# Patient Record
Sex: Female | Born: 1965 | Race: White | Hispanic: No | Marital: Single | Smoking: Never smoker
Health system: Southern US, Community
[De-identification: ages and names within clinical notes are randomized; demographics above are authoritative.]

## PROBLEM LIST (undated history)

## (undated) DIAGNOSIS — R87619 Unspecified abnormal cytological findings in specimens from cervix uteri: Secondary | ICD-10-CM

## (undated) DIAGNOSIS — T7840XA Allergy, unspecified, initial encounter: Secondary | ICD-10-CM

## (undated) DIAGNOSIS — F329 Major depressive disorder, single episode, unspecified: Secondary | ICD-10-CM

## (undated) DIAGNOSIS — F32A Depression, unspecified: Secondary | ICD-10-CM

## (undated) DIAGNOSIS — M5126 Other intervertebral disc displacement, lumbar region: Secondary | ICD-10-CM

## (undated) DIAGNOSIS — IMO0002 Reserved for concepts with insufficient information to code with codable children: Secondary | ICD-10-CM

## (undated) HISTORY — DX: Allergy, unspecified, initial encounter: T78.40XA

## (undated) HISTORY — DX: Unspecified abnormal cytological findings in specimens from cervix uteri: R87.619

## (undated) HISTORY — DX: Reserved for concepts with insufficient information to code with codable children: IMO0002

## (undated) HISTORY — DX: Depression, unspecified: F32.A

## (undated) HISTORY — DX: Major depressive disorder, single episode, unspecified: F32.9

## (undated) HISTORY — PX: BACK SURGERY: SHX140

## (undated) HISTORY — DX: Other intervertebral disc displacement, lumbar region: M51.26

---

## 1997-11-26 ENCOUNTER — Other Ambulatory Visit: Admission: RE | Admit: 1997-11-26 | Discharge: 1997-11-26 | Payer: Self-pay | Admitting: Obstetrics and Gynecology

## 1998-11-24 ENCOUNTER — Encounter: Admission: RE | Admit: 1998-11-24 | Discharge: 1999-02-22 | Payer: Self-pay | Admitting: Anesthesiology

## 2000-12-31 ENCOUNTER — Other Ambulatory Visit: Admission: RE | Admit: 2000-12-31 | Discharge: 2000-12-31 | Payer: Self-pay | Admitting: *Deleted

## 2002-04-22 ENCOUNTER — Inpatient Hospital Stay (HOSPITAL_COMMUNITY): Admission: EM | Admit: 2002-04-22 | Discharge: 2002-04-24 | Payer: Self-pay | Admitting: Emergency Medicine

## 2002-04-23 ENCOUNTER — Encounter: Payer: Self-pay | Admitting: Internal Medicine

## 2002-04-24 ENCOUNTER — Encounter: Payer: Self-pay | Admitting: Orthopaedic Surgery

## 2002-05-08 ENCOUNTER — Encounter: Payer: Self-pay | Admitting: Neurosurgery

## 2002-05-08 ENCOUNTER — Ambulatory Visit (HOSPITAL_COMMUNITY): Admission: RE | Admit: 2002-05-08 | Discharge: 2002-05-09 | Payer: Self-pay | Admitting: Neurosurgery

## 2002-06-25 ENCOUNTER — Other Ambulatory Visit: Admission: RE | Admit: 2002-06-25 | Discharge: 2002-06-25 | Payer: Self-pay | Admitting: Family Medicine

## 2002-07-15 ENCOUNTER — Encounter (HOSPITAL_COMMUNITY): Admission: RE | Admit: 2002-07-15 | Discharge: 2002-10-13 | Payer: Self-pay | Admitting: Family Medicine

## 2002-07-16 ENCOUNTER — Encounter: Payer: Self-pay | Admitting: Family Medicine

## 2002-09-16 ENCOUNTER — Encounter: Admission: RE | Admit: 2002-09-16 | Discharge: 2002-09-16 | Payer: Self-pay | Admitting: Neurosurgery

## 2002-09-16 ENCOUNTER — Encounter: Payer: Self-pay | Admitting: Neurosurgery

## 2002-09-16 ENCOUNTER — Encounter: Payer: Self-pay | Admitting: Diagnostic Radiology

## 2002-10-02 ENCOUNTER — Encounter: Payer: Self-pay | Admitting: Neurosurgery

## 2002-10-02 ENCOUNTER — Encounter: Admission: RE | Admit: 2002-10-02 | Discharge: 2002-10-02 | Payer: Self-pay | Admitting: Neurosurgery

## 2003-02-05 ENCOUNTER — Ambulatory Visit (HOSPITAL_COMMUNITY): Admission: RE | Admit: 2003-02-05 | Discharge: 2003-02-06 | Payer: Self-pay | Admitting: Neurosurgery

## 2004-08-08 ENCOUNTER — Other Ambulatory Visit: Admission: RE | Admit: 2004-08-08 | Discharge: 2004-08-08 | Payer: Self-pay | Admitting: Family Medicine

## 2004-09-01 ENCOUNTER — Ambulatory Visit: Payer: Self-pay | Admitting: Pain Medicine

## 2004-09-05 ENCOUNTER — Encounter: Admission: RE | Admit: 2004-09-05 | Discharge: 2004-09-05 | Payer: Self-pay | Admitting: Family Medicine

## 2004-09-08 ENCOUNTER — Ambulatory Visit: Payer: Self-pay | Admitting: Pain Medicine

## 2004-10-27 ENCOUNTER — Ambulatory Visit: Payer: Self-pay | Admitting: Pain Medicine

## 2004-11-22 ENCOUNTER — Ambulatory Visit: Payer: Self-pay | Admitting: Pain Medicine

## 2004-12-26 ENCOUNTER — Ambulatory Visit: Payer: Self-pay | Admitting: Physician Assistant

## 2005-01-22 ENCOUNTER — Ambulatory Visit: Payer: Self-pay | Admitting: Physician Assistant

## 2005-02-13 ENCOUNTER — Ambulatory Visit: Payer: Self-pay | Admitting: Physician Assistant

## 2005-03-26 ENCOUNTER — Ambulatory Visit: Payer: Self-pay | Admitting: Physician Assistant

## 2005-04-26 ENCOUNTER — Ambulatory Visit: Payer: Self-pay | Admitting: Physician Assistant

## 2005-05-22 ENCOUNTER — Ambulatory Visit: Payer: Self-pay | Admitting: Physician Assistant

## 2005-06-20 ENCOUNTER — Ambulatory Visit: Payer: Self-pay | Admitting: Physician Assistant

## 2005-07-19 ENCOUNTER — Ambulatory Visit: Payer: Self-pay | Admitting: Physician Assistant

## 2005-08-21 ENCOUNTER — Ambulatory Visit: Payer: Self-pay | Admitting: Physician Assistant

## 2005-09-18 ENCOUNTER — Ambulatory Visit: Payer: Self-pay | Admitting: Physician Assistant

## 2005-10-17 ENCOUNTER — Ambulatory Visit: Payer: Self-pay | Admitting: Physician Assistant

## 2005-11-26 ENCOUNTER — Ambulatory Visit: Payer: Self-pay | Admitting: Physician Assistant

## 2005-12-18 ENCOUNTER — Ambulatory Visit: Payer: Self-pay | Admitting: Physician Assistant

## 2006-01-15 ENCOUNTER — Ambulatory Visit: Payer: Self-pay | Admitting: Physician Assistant

## 2006-02-14 ENCOUNTER — Ambulatory Visit: Payer: Self-pay | Admitting: Physician Assistant

## 2006-03-15 ENCOUNTER — Ambulatory Visit: Payer: Self-pay | Admitting: Physician Assistant

## 2006-04-16 ENCOUNTER — Ambulatory Visit: Payer: Self-pay | Admitting: Physician Assistant

## 2006-05-16 ENCOUNTER — Ambulatory Visit: Payer: Self-pay | Admitting: Physician Assistant

## 2006-06-13 ENCOUNTER — Ambulatory Visit: Payer: Self-pay | Admitting: Physician Assistant

## 2006-07-12 ENCOUNTER — Ambulatory Visit: Payer: Self-pay | Admitting: Physician Assistant

## 2006-08-15 ENCOUNTER — Ambulatory Visit: Payer: Self-pay | Admitting: Physician Assistant

## 2006-09-12 ENCOUNTER — Ambulatory Visit: Payer: Self-pay | Admitting: Physician Assistant

## 2006-10-10 ENCOUNTER — Ambulatory Visit: Payer: Self-pay | Admitting: Physician Assistant

## 2006-10-24 ENCOUNTER — Encounter: Payer: Self-pay | Admitting: Pain Medicine

## 2006-10-28 ENCOUNTER — Encounter: Payer: Self-pay | Admitting: Pain Medicine

## 2006-10-30 ENCOUNTER — Ambulatory Visit: Payer: Self-pay | Admitting: Pain Medicine

## 2006-11-14 ENCOUNTER — Ambulatory Visit: Payer: Self-pay | Admitting: Physician Assistant

## 2006-12-13 ENCOUNTER — Ambulatory Visit: Payer: Self-pay | Admitting: Physician Assistant

## 2007-01-14 ENCOUNTER — Ambulatory Visit: Payer: Self-pay | Admitting: Physician Assistant

## 2007-02-11 ENCOUNTER — Ambulatory Visit: Payer: Self-pay | Admitting: Physician Assistant

## 2007-05-13 ENCOUNTER — Ambulatory Visit: Payer: Self-pay | Admitting: Physician Assistant

## 2007-09-01 ENCOUNTER — Ambulatory Visit: Payer: Self-pay | Admitting: Pain Medicine

## 2007-09-23 ENCOUNTER — Ambulatory Visit: Payer: Self-pay | Admitting: Physician Assistant

## 2007-11-27 ENCOUNTER — Ambulatory Visit: Payer: Self-pay | Admitting: Physician Assistant

## 2008-10-05 ENCOUNTER — Emergency Department (HOSPITAL_COMMUNITY): Admission: EM | Admit: 2008-10-05 | Discharge: 2008-10-05 | Payer: Self-pay | Admitting: Emergency Medicine

## 2010-06-03 LAB — URINE CULTURE: Colony Count: >=100000

## 2010-06-03 LAB — GLUCOSE, CAPILLARY: Glucose-Capillary: 110 mg/dL — ABNORMAL HIGH (ref 70–99)

## 2010-06-03 LAB — POCT URINALYSIS DIP (DEVICE)
Glucose, UA: 250 mg/dL — AB
Ketones, ur: 15 mg/dL — AB
Nitrite: POSITIVE — AB
Protein, ur: 100 mg/dL — AB
Specific Gravity, Urine: 1.01 (ref 1.005–1.030)
Urobilinogen, UA: 4 mg/dL — ABNORMAL HIGH (ref 0.0–1.0)
pH: 8.5 — ABNORMAL HIGH (ref 5.0–8.0)

## 2010-06-03 LAB — POCT PREGNANCY, URINE: Preg Test, Ur: NEGATIVE

## 2010-07-14 NOTE — H&P (Signed)
NAME:  Andrea Petersen, Andrea Petersen                       ACCOUNT NO.:  [id number]   MEDICAL RECORD NO.:  [id number]                   PATIENT TYPE:  OIB   LOCATION:  3011                                 FACILITY:  [facility]   PHYSICIAN:  Hilda Lias, M.D.                DATE OF BIRTH:  10-29-1965   DATE OF ADMISSION:  02/05/2003  DATE OF DISCHARGE:                                HISTORY & PHYSICAL   HISTORY OF PRESENT ILLNESS:  [name] is a lady who underwent L5-S1  diskectomy back almost 10 months ago.  The patient did well, and later on  she developed more back pain with radiation down to the right leg associated  with weakness.  The patient although was able to work, nevertheless, was  taking quite a bit of pain medications.  Right now she is at the point that  she cannot take any longer the pain and she wants to proceed with surgery as  soon as possible.   PAST MEDICAL HISTORY:  L5-S1 diskectomy.   SOCIAL HISTORY:  Negative.   FAMILY HISTORY:  Father has a history of cancer of the colon and mother had  a tumor removed by me about 15 years ago.   REVIEW OF SYSTEMS:  Positive for back and leg pain.   PHYSICAL EXAMINATION:  GENERAL:  The is a patient who came to my office and  she was limping from the right leg.  HEENT:  Normal.  NECK:  Normal.  LUNGS:  Clear.  HEART:  Normal.  ABDOMEN:  Normal.  EXTREMITIES:  Normal.  NEUROLOGIC:  Mental status and cranial nerves normal.  Strength 5/5 except  for weakness with dorsi and plantar flexion of the right foot.  Sensation:  She complains of numbness which involves mostly the outside of the foot.  Reflexes are decreased to the right ankle jerk.  Straight leg raising, left  side positive about 80 degrees, right side about 45 degrees.   LABORATORY DATA:  The MRI showed that she had a  recurrent disk at the level  of L5-S1 central to the right, and worsening degenerative disk disease at  the L4-5.   CLINICAL IMPRESSION:  1. Right L5-S1  recurrent herniated disk.  2. Degenerative disk disease at the level of L2-3, L3-4, and L4-5.   RECOMMENDATIONS:  The patient is being admitted for surgery which will be a  right L5-S1 diskectomy.  She knows all of the risks, as well as infection,  CSF leak, continuation of back pain because of her degenerative disk  disease, and need for further surgery.                                                Hilda Lias, M.D.    EB/MEDQ  D:  02/05/2003  T:  02/05/2003  Job:  [id number]

## 2010-07-14 NOTE — Op Note (Signed)
   NAME:  Andrea Petersen, Andrea Petersen                       ACCOUNT NO.:  [id number]   MEDICAL RECORD NO.:  [id number]                   PATIENT TYPE:  OIB   LOCATION:  3029                                 FACILITY:  [facility]   PHYSICIAN:  Hilda Lias, M.D.                DATE OF BIRTH:  [birth year]   DATE OF PROCEDURE:  05/08/2002  DATE OF DISCHARGE:                                 OPERATIVE REPORT   PREOPERATIVE DIAGNOSIS:  Right L5-S1 herniated disk with a fragment going to  the body of L5.   POSTOPERATIVE DIAGNOSIS:  Right L5-S1 herniated disk with a fragment going  to the body of L5.   PROCEDURE:  Right L5-S1 removal of seven free fragments of disk,  foraminotomy, microscope, C-arm, [employer].   SURGEON:  Hilda Lias, M.D.   ASSISTANT:  Coletta Memos, M.D.   CLINICAL HISTORY:  The patient is admitted because of back pain with  radiation down to the right leg associated with weakness of the right foot.  MRI shows a large herniated disk with a fragment going to the body of L5.  Surgery was advised.   DESCRIPTION OF PROCEDURE:  The patient was taken to the OR, and she was  positioned in a prone manner.  The back was prepped with Betadine.  With the  C-arm we localized the area between L5-S1 on the right side.  An incision  away from the midline through the skin and fascia was done.  A dilator was  inserted and then a retractor was done.  We were able to visualize L5-S1.  With the drill we drilled the lower lamina of L5 and the upper of S1.  With  the help of the microscope we removed the yellow ligament.  There was some  evidence of cortisone injection in that area.  A foraminotomy was  accomplished to be able to mobilize the S1 nerve root.  The S1 nerve root  was found to be swollen and inflamed.  Retraction was done and, indeed, at  the body of L5 we removed seven free fragments of disk.  The disk itself was  absolutely closed, and there was no need to do any diskectomy.  Investigation  above, below, and laterally was negative at the end.  Valsalva  maneuver was negative.  Then the area was irrigated and fentanyl was left in  the epidural space, and the wound was closed with Vicryl and a Steri-Strip.                                               Hilda Lias, M.D.    EB/MEDQ  D:  05/08/2002  T:  05/08/2002  Job:  [id number]

## 2010-07-14 NOTE — Op Note (Signed)
NAME:  Andrea Petersen, Andrea Petersen                       ACCOUNT NO.:  [id number]   MEDICAL RECORD NO.:  [id number]                   PATIENT TYPE:  OIB   LOCATION:  3172                                 FACILITY:  [facility]   PHYSICIAN:  Hilda Lias, M.D.                DATE OF BIRTH:  Mar 18, 1965   DATE OF PROCEDURE:  02/05/2003  DATE OF DISCHARGE:                                 OPERATIVE REPORT   PREOPERATIVE DIAGNOSIS:  Recurrent L5-S1 herniated disk with degenerative  disk disease at L3-4 and L5-S1.   POSTOPERATIVE DIAGNOSIS:  Recurrent L5-S1 herniated disk with degenerative  disk disease at L3-4 and L5-S1.   PROCEDURE:  Right L5-S1 diskectomy with removal of large recurrent disk  including end plate, decompression of the L5 and S1 nerve root using  microscope.   SURGEON:  Hilda Lias, M.D.   ASSISTANT:  Danae Orleans. Venetia Maxon, M.D.   BRIEF HISTORY:  The patient was admitted because of back pain with right leg  pain.  X-rays showed degenerative disk disease with a large recurrent disk  at L5-S1.  Surgery was advised.   DESCRIPTION OF PROCEDURE:  The patient was taken to the OR and general  anesthesia was administered.  Using the previous incision we opened the skin  all the way down to the fascia.  The fascia was retracted and we entered  into the disk space.  Then using the microscope we drilled the lower lamina  of L5 and the upper  of S1.  Lysis of adhesions was accomplished.  After the  lysis, we were able to retract laterally the S1 nerve root which was  swollen.  Indeed, there were multiple fragments of disk including the end  plates.  All of them were into the body of S1, but mostly going up to L5  medially and laterally.  The disk space was almost completely closed.  Using  the dissection we were able to remove at least ___________ of the foramen  and disk.  At the end we had decompression of the L5-S1 nerve root.  Investigation immediately showed a large ridge and nothing  compromising  either the L5 or S1 nerve root.  From then on the area was irrigated. A  Valsalva maneuver was negative.  Fentanyl and Demerol were injected into the  disk space and the wound was closed with Vicryl and Steri-Strips.                                               Hilda Lias, M.D.    EB/MEDQ  D:  02/05/2003  T:  02/06/2003  Job:  [id number]

## 2010-07-14 NOTE — H&P (Signed)
   NAME:  Andrea Petersen, Andrea Petersen                       ACCOUNT NO.:  [id number]   MEDICAL RECORD NO.:  [id number]                   PATIENT TYPE:  OIB   LOCATION:  3029                                 FACILITY:  [facility]   PHYSICIAN:  Hilda Lias, M.D.                DATE OF BIRTH:  [birth year]   DATE OF ADMISSION:  05/08/2002  DATE OF DISCHARGE:                                HISTORY & PHYSICAL   HISTORY:  Andrea Petersen is a lady who came to my office complaining of back  pain radiates down to the right leg.  This problem has been going on since  2000 and it is getting worse, up to the point that she was admitted by the  orthopedic surgery to [facility] for intravenous morphine for pain  control and had an emergency MRI.  The pain is going to the right leg,  associated with numbness.  She denies involvement of the left leg.  She was  advised of surgery and came to be admitted.   PAST MEDICAL HISTORY:  Negative.   SOCIAL HISTORY:  Negative.   FAMILY HISTORY:  Mother is 25.  She had a tumor removed from the brain 15  years ago by me.  Her father is 3 with cancer of the colon.   REVIEW OF SYSTEMS:  Positive only for back and right leg pain.   PHYSICAL EXAMINATION:  HEENT:  Normal.  NECK:  Normal.  LUNGS:  Clear.  HEART:  Sounds normal.  There were no murmurs.  Normal pulses.  __________  normal.  Strength 5/5 except for in the right foot there was weakness for  plantar flexion 3/5 and dorsiflexion 4/5.  There is numbness involved with  the L5-S1 nerve root.  Reflexes symmetrical but absent on the right ankle.   LABORATORY DATA:  MRI showed that she has some degenerative disease at L4-5.  At L5-S1 she has a herniated disk going up to the L4-5.   IMPRESSION:  1. Right L5-S1 herniated disk with a free fragment.  2. Degenerative disk disease.   RECOMMENDATIONS:  The patient is being admitted for surgery.  The procedure  will be a right L5-S1 diskectomy.  We are going to use the  Matrix as well as  the CR with the microscope.  The risks are no improvement because of the  longstanding radiculopathy, worsening of the pain, need for further surgery,  bleeding, infection.  She requires more opinion.                                               Hilda Lias, M.D.    EB/MEDQ  D:  05/08/2002  T:  05/08/2002  Job:  [id number]

## 2011-01-25 ENCOUNTER — Other Ambulatory Visit (HOSPITAL_COMMUNITY)
Admission: RE | Admit: 2011-01-25 | Discharge: 2011-01-25 | Disposition: A | Discharge status: Home / Self Care | Payer: Self-pay | Source: Ambulatory Visit | Attending: Physician Assistant | Admitting: Physician Assistant

## 2011-01-25 ENCOUNTER — Ambulatory Visit (INDEPENDENT_AMBULATORY_CARE_PROVIDER_SITE_OTHER): Payer: Self-pay | Admitting: Physician Assistant

## 2011-01-25 ENCOUNTER — Encounter: Payer: Self-pay | Admitting: Advanced Practice Midwife

## 2011-01-25 VITALS — BP 138/93 | HR 79 | Temp 98.7°F | Ht 65.0 in | Wt 154.0 lb

## 2011-01-25 DIAGNOSIS — IMO0002 Reserved for concepts with insufficient information to code with codable children: Secondary | ICD-10-CM

## 2011-01-25 DIAGNOSIS — R87613 High grade squamous intraepithelial lesion on cytologic smear of cervix (HGSIL): Secondary | ICD-10-CM

## 2011-01-25 DIAGNOSIS — N871 Moderate cervical dysplasia: Secondary | ICD-10-CM | POA: Insufficient documentation

## 2011-01-25 HISTORY — DX: High grade squamous intraepithelial lesion on cytologic smear of cervix (HGSIL): R87.613

## 2011-01-25 HISTORY — DX: Reserved for concepts with insufficient information to code with codable children: IMO0002

## 2011-01-25 LAB — POCT PREGNANCY, URINE: Preg Test, Ur: NEGATIVE

## 2011-01-25 NOTE — Progress Notes (Signed)
Chief Complaint:  Colposcopy and Pain   Andrea Petersen is  45 y.o. [id number].  Patient's last menstrual period was 01/18/2011.Marland Kitchen  Her pregnancy status is negative.  She presents complaining of Colposcopy and Pain  Presents for colpo secondary to HSIL pap at [facility]. Reports intermittent discomfort with intercourse. States infrequent sexual encounters and occasional discomfort less than once a month.  Obstetrical/Gynecological History: OB History    Grav Para Term Preterm Abortions TAB SAB Ect Mult Living   2 2 2       2       Past Medical History: Past Medical History  Diagnosis Date  . Abnormal Pap smear   . 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 Back surgery     march 2004, dec 2004    Family History: Family History  Problem Relation Age of Onset  . Colon cancer Father   . Breast cancer Mother   . Hypertension Mother   . Miscarriages / [place] Mother     Social History: History  Substance Use Topics  . Smoking status: Never Smoker   . Smokeless tobacco: Never Used  . Alcohol Use: 2.4 - 3.0 oz/week    4-5 Glasses of wine per week    Allergies:  Allergies  Allergen Reactions  . Seasonal      (Not in a hospital admission)  Review of Systems - Negative except what has been reviewed in the HPI  Physical Exam   Blood pressure 138/93, pulse 79, temperature 98.7 F (37.1 C), temperature source Oral, height 5\' 5"  (1.651 m), weight 154 lb (69.854 kg), last menstrual period 01/18/2011.  General: General appearance - alert, well appearing, and in no distress, oriented to person, place, and time and overweight Mental status - alert, oriented to person, place, and time, normal mood, behavior, speech, dress, motor activity, and thought processes, affect appropriate to mood Focused Gynecological Exam:   Patient given informed consent, signed copy in  the chart, time out was performed.  Placed in lithotomy position. Cervix viewed with speculum and colposcope after application of acetic acid.   Colposcopy adequate?  yes Acetowhite lesions?yes Punctation?yes Mosaicism?  yes Abnormal vasculature?  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   Assessment: Patient Active Problem List  Diagnoses  . 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   She will return in 2 weeks for results.  Discussed with patient if CIN II or great tx need, would rec LEEP since done with childbearing.  Jiyan Walkowski E. 01/25/2011,4:31 PM

## 2011-01-25 NOTE — Patient Instructions (Signed)
Colposcopy Care After Colposcopy is a procedure in which a special tool is used to magnify the surface of the cervix. A tissue sample (biopsy) may also be taken. This sample will be looked at for cervical cancer or other problems. After the test:  You may have some cramping.   Lie down for a few minutes if you feel lightheaded.    You may have some bleeding which should stop in a few days.  HOME CARE  Do not have sex or use tampons for 2 to 3 days or as told.   Only take medicine as told by your doctor.   Continue to take your birth control pills as usual.  Finding out the results of your test Ask when your test results will be ready. Make sure you get your test results. GET HELP RIGHT AWAY IF:  You are bleeding a lot or are passing blood clots.   You develop a fever of 102 F (38.9 C) or higher.   You have abnormal vaginal discharge.   You have cramps that do not go away with medicine.   You feel lightheaded, dizzy, or pass out (faint).  MAKE SURE YOU:   Understand these instructions.   Will watch your condition.   Will get help right away if you are not doing well or get worse.  Document Released: 08/01/2007 Document Revised: 10/25/2010 Document Reviewed: 08/01/2007 Exit[employer] Patient Information 2012 [employer]. 

## 2011-02-06 ENCOUNTER — Telehealth: Payer: Self-pay | Admitting: *Deleted

## 2011-02-06 NOTE — Telephone Encounter (Signed)
Response received from Alexander for pt to have appt for results/LEEP eval.  I called pt and informed her of appt on 03/07/11 @ 1445. Pt voiced understanding and agreed.

## 2011-02-06 NOTE — Telephone Encounter (Signed)
Pt left message requesting results via phone from last visit.   Request will be routed to [facility] CNM for response.

## 2011-03-07 ENCOUNTER — Ambulatory Visit (INDEPENDENT_AMBULATORY_CARE_PROVIDER_SITE_OTHER): Payer: Self-pay | Admitting: Obstetrics & Gynecology

## 2011-03-07 ENCOUNTER — Encounter: Payer: Self-pay | Admitting: Obstetrics & Gynecology

## 2011-03-07 VITALS — BP 150/93 | HR 81 | Temp 99.4°F | Ht 64.0 in | Wt 152.2 lb

## 2011-03-07 DIAGNOSIS — N871 Moderate cervical dysplasia: Secondary | ICD-10-CM

## 2011-03-07 NOTE — Progress Notes (Signed)
  Subjective:    Patient ID: Andrea Petersen, female    DOB: 1965-07-07, 46 y.o.   MRN: [id number]  HPI She is here to discuss there results of her colpo/biopsies. 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 The ECC was normal.     Assessment & Plan:  As above- I discussed the risks and benefits of LEEP and cryotherapy. She opts for cryo. She is going to check and see if she was screened for GC and Chlamydia. If not, we can screen her before her cryo.

## 2011-04-04 ENCOUNTER — Ambulatory Visit (INDEPENDENT_AMBULATORY_CARE_PROVIDER_SITE_OTHER): Payer: Self-pay | Admitting: Obstetrics & Gynecology

## 2011-04-04 ENCOUNTER — Encounter: Payer: Self-pay | Admitting: Obstetrics & Gynecology

## 2011-04-04 VITALS — BP 149/100 | HR 99 | Temp 97.4°F | Resp 20 | Ht 65.0 in | Wt 151.0 lb

## 2011-04-04 DIAGNOSIS — N879 Dysplasia of cervix uteri, unspecified: Secondary | ICD-10-CM

## 2011-04-04 HISTORY — PX: OTHER SURGICAL HISTORY: SHX169

## 2011-04-04 LAB — POCT PREGNANCY, URINE: Preg Test, Ur: NEGATIVE

## 2011-04-04 NOTE — Patient Instructions (Signed)
Cervical Dysplasia Cervical dysplasia is a condition in which a woman has abnormal changes in the cells of her cervix. The cervix is the opening to the uterus (womb) between the vagina and the uterus. These changes are called cervical dysplasia and may be the first signs of cervical cancer. These cells can be taken from the cervix during a Pap test and then looked at under a microscope. With early detection, treatment, and close follow-up care, nearly all cervical dysplasia can be cured. If untreated, the mild to moderate stages of dysplasia often grow more severe.  RISK FACTORS  The following increase the risk for cervical dysplasia.  Having had a sexually transmitted disease, including:   Chlamydia.   Human papilloma virus (HPV).   Becoming sexually active before age 18.   Having had more than 1 sexual partner.   Not using protection, such as condoms, during sexual intercourse, especially with new sexual partners.   Having had cancer of the vagina or vulva.   Having a sexual partner whose previous partner had cancer of the cervix or cervical dysplasia.   Having a sexual partner who has or has had cancer of the penis.   Having a weakened immune system (HIV, organ transplant).   Being the daughter of a woman who took DES (diethylstilbestrol) during pregnancy.   A history of cervical cancer in a woman's sister or mother.   Smoking.   Having had an abnormal Pap test in the past.  SYMPTOMS  There are usually no symptoms. If there are symptoms, they may be vague such as:  Abnormal vaginal discharge.   Bleeding between periods or following intercourse.   Bleeding during menopause.   Pain on intercourse (dyspareunia).  DIAGNOSIS   The Pap test is the best way of detecting abnormalities of the cervix.   Biopsy (removing a piece of tissue to look at under the microscope) of the cervix when the Pap test is abnormal or when the Pap test is normal, but the cervix looks abnormal.    TREATMENT  Catching and treating the changes early with Pap tests can prevent cervical cancer.  Cryotherapy freezes the abnormal cells with a steel tip instrument.   A laser can be used to remove the abnormal cells.   Loop electrocautery excision procedure (LEEP). This procedure uses a heated electrical loop to remove a cone-like portion of the cervix, including the cervical canal.   For more serious cases of cervical dysplasia, the abnormal tissue may be removed surgically by:   A cone biopsy (by cold knife, laser or LEEP). A procedure in which a portion of the center of the cervix with the cervical canal is removed.   The uterus and cervix are removed (hysterectomy).  Your caregiver will advise you regarding the need and timing of Pap tests in your follow-up. Women who have been treated for dysplasia should be closely followed with pelvic exams and Pap tests. During the first year following treatment of cervical dysplasia, Pap tests should be done every 3 to 4 months. In the second year, the schedule is every 6 months, or as recommended by your caregiver. See your caregiver for new or worsening problems. HOME CARE INSTRUCTIONS   Follow the instructions and recommendations of your caregiver regarding medicines and follow-up appointments.   Only take over-the-counter or prescription medicines for pain or discomfort as directed by your caregiver.   Cramping and pelvic discomfort may follow cryotherapy. It is not abnormal to have watery discharge for several weeks after.     Laser, cone surgery, cryotherapy or LEEP can cause a bad smelling vaginal discharge. It may also cause vaginal bleeding for a couple weeks following the procedure. The discharge may be black from the paste used to control bleeding from the cone site. This is normal.   Do not use tampons, have sexual intercourse or douche until your caregiver says it is okay.  SEEK MEDICAL CARE IF:   You develop genital warts.   You  need a prescription for pain medicine following your treatment.  SEEK IMMEDIATE MEDICAL CARE IF:   Your bleeding is heavier than a normal menstrual period.   You develop bright red bleeding, especially if you have blood clots.   You have a fever.   You have increasing cramps or pain not relieved with medicine.   You are lightheaded, unusually weak, or have fainting spells.   You have abnormal vaginal discharge.   You develop abdominal pain.  PREVENTION   The surest way to prevent cervical dysplasia is to abstain from sexual intercourse.   Practice safe sex, use condoms and have only one sex partner who does not have other sex partners.   A Pap test is done to screen for cervical cancer.   The first Pap test should be done at age 21.   Between ages 21 and 29, Pap tests are repeated every 2 years.   Beginning at age 30, you are advised to have a Pap test every 3 years as long as your past 3 Pap tests have been normal.   Some women have medical problems that increase the chance of getting cervical cancer. Talk to your caregiver about these problems. It is especially important to talk to your caregiver if a new problem develops soon after your last Pap test. In these cases, your caregiver may recommend more frequent screening and Pap tests.   The above recommendations are the same for women who have or have not gotten the vaccine for HPV (Human Papillomavirus).   If you had a hysterectomy for a problem that was not a cancer or a condition that could lead to cancer, then you no longer need Pap tests. However, even if you no longer need a Pap test, a regular exam is a good idea to make sure no other problems are starting.    If you are between ages 65 and 70, and you have had normal Pap tests going back 10 years, you no longer need Pap tests. However, even if you no longer need a Pap test, a regular exam is a good idea to make sure no other problems are starting.    If you have  had past treatment for cervical cancer or a condition that could lead to cancer, you need Pap tests and screening for cancer for at least 20 years after your treatment.   If Pap tests have been discontinued, risk factors (such as a new sexual partner) need to be re-assessed to determine if screening should be resumed.   Some women may need screenings more often if they are at high risk for cervical cancer.   Your caregiver may do additional tests including:   Colposcopy. A procedure in which a special microscope magnifies the cells and allows the provider to closely examine the cervix, vagina, and vulva.   Biopsy. A small tissue sample is taken from the cervix, vagina or vulva. This is generally done in your caregivers office.   A cone biopsy (cold knife or laser). A large tissue sample is   taken from the cervix. This procedure is usually done in an operating room under a general anesthetic. The cone often removes all abnormal tissue and so may also complete the treatment.   LEEP, also removing a circular portion of the cervix and is done in a doctors office under a local anesthetic.   Now there is a vaccine, Gardasil, that was developed to prevent the HPV'S that can cause cancer of the cervix and genital warts. It is recommended for females ages 9 to 26. It should not be given to pregnant women until more is known about its effects on the fetus. Not all cancers of the cervix are caused by the HPV. Routine gynecology exams and Pap tests should continue as recommended by your caregiver.  Document Released: 02/12/2005 Document Revised: 10/25/2010 Document Reviewed: 02/04/2008 Exit[employer] Patient Information 2012 [employer]. 

## 2011-04-04 NOTE — Progress Notes (Signed)
Pt states she is very anxious about cryo procedure today. She has taken tramadol 100 mg total as prep for exam.  Pt was advised to seek care from a PCP for hypertension evaluation and treatment if indicated. [id number] Patient's last menstrual period was 03/31/2011. 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 Marice Potter for cryotherapy today. The procedure and risks were explained and her questions were answered. Consent was signed and time-out performed. Small probe was used for 65min-5-3 technique without complications.

## 2011-04-09 ENCOUNTER — Ambulatory Visit (INDEPENDENT_AMBULATORY_CARE_PROVIDER_SITE_OTHER): Payer: Self-pay | Admitting: Family

## 2011-04-09 ENCOUNTER — Encounter: Payer: Self-pay | Admitting: Family

## 2011-04-09 ENCOUNTER — Telehealth: Payer: Self-pay | Admitting: Obstetrics and Gynecology

## 2011-04-09 VITALS — BP 140/96 | HR 88 | Temp 99.4°F | Ht 65.0 in | Wt 150.2 lb

## 2011-04-09 DIAGNOSIS — N949 Unspecified condition associated with female genital organs and menstrual cycle: Secondary | ICD-10-CM

## 2011-04-09 DIAGNOSIS — R102 Pelvic and perineal pain: Secondary | ICD-10-CM

## 2011-04-09 LAB — POCT URINALYSIS DIP (DEVICE)
Bilirubin Urine: NEGATIVE
Glucose, UA: NEGATIVE mg/dL
Hgb urine dipstick: NEGATIVE
Ketones, ur: NEGATIVE mg/dL
Leukocytes, UA: NEGATIVE
Nitrite: NEGATIVE
Protein, ur: NEGATIVE mg/dL
Specific Gravity, Urine: 1.01 (ref 1.005–1.030)
Urobilinogen, UA: 0.2 mg/dL (ref 0.0–1.0)
pH: 7 (ref 5.0–8.0)

## 2011-04-09 LAB — WET PREP, GENITAL
Trich, Wet Prep: NONE SEEN
Yeast Wet Prep HPF POC: NONE SEEN

## 2011-04-09 MED ORDER — TRAMADOL HCL 50 MG PO TABS: 50.0000 mg | ORAL_TABLET | Freq: Four times a day (QID) | ORAL | Status: AC | PRN

## 2011-04-09 NOTE — Telephone Encounter (Signed)
Patient called with c/o continuous pelvic pain, described as dull, and achy since last Wednesday post-cryo procedure. Reported 3/10 on pain scale w/o pain meds. States that she also have yellow tinge d/c with slight odor. Appointment made for this afternoon at 1pm for further evaluation. Patient agreed.

## 2011-04-09 NOTE — Progress Notes (Signed)
States has found out had intercourse last 03/12/11 and that partner had removed condom during intercourse. Is here today for c/o pelvic pain that started 04/06/11-states having  yellowish discharge and odor has increased.Discussed patient BP elevated today and last 2 visits, encouraged patient to see primary care provider which is [facility]. Recorded pt BP's and gave her to take with her to visit.

## 2011-04-09 NOTE — Progress Notes (Signed)
  Subjective:    Patient ID: Andrea Petersen, female    DOB: [birth year], 46 y.o.   MRN: [id number]  HPI Pt here with report of pelvic pain that increased in intensity on 04/06/11.  S/P cryotherapy on 04/04/11.  Pain is described as "crampy" and rated a 5/10.  Received relief with Tramadol.  Also reports yellowish discharge that started on 04/06/11 with slight odor.     Review of Systems  Constitutional: Negative.   Genitourinary: Positive for vaginal discharge (yellowish) and pelvic pain. Negative for vaginal bleeding.       Objective:   Physical Exam  Constitutional: She is oriented to person, place, and time. She appears well-developed and well-nourished. No distress.  HENT:  Head: Normocephalic.  Neck: Normal range of motion. Neck supple.  Abdominal: Soft. There is no tenderness.  Genitourinary: Uterus is tender (mild). Cervix exhibits discharge ( thick, white scar tissue covering cervix). Vaginal discharge (yellow tinged watery discharge) found.  Neurological: She is alert and oriented to person, place, and time.  Skin: Skin is warm and dry.     Results for orders placed in visit on 04/09/11 (from the past 24 hour(s))  POCT URINALYSIS DIP (DEVICE)     Status: Normal   Collection Time   04/09/11  2:02 PM      Component Value Range   Glucose, UA NEGATIVE  NEGATIVE (mg/dL)   Bilirubin Urine NEGATIVE  NEGATIVE    Ketones, ur NEGATIVE  NEGATIVE (mg/dL)   Specific Gravity, Urine 1.010  1.005 - 1.030    Hgb urine dipstick NEGATIVE  NEGATIVE    pH 7.0  5.0 - 8.0    Protein, ur NEGATIVE  NEGATIVE (mg/dL)   Urobilinogen, UA 0.2  0.0 - 1.0 (mg/dL)   Nitrite NEGATIVE  NEGATIVE    Leukocytes, UA NEGATIVE  NEGATIVE         Assessment & Plan:  Post Cryo Pain  Plan:   Urine GC/CT Wet prep TX Tramadol  F/U as requested [facility]

## 2011-04-09 NOTE — Patient Instructions (Signed)
Cryotherapy Cryotherapy means treatment with cold. Ice or gel packs can be used to reduce both pain and swelling. Ice is the most helpful within the first 24 to 48 hours after an injury or flareup from overusing a muscle or joint. Sprains, strains, spasms, burning pain, shooting pain, and aches can all be eased with ice. Ice can also be used when recovering from surgery. Ice is effective, has very few side effects, and is safe for most people to use. PRECAUTIONS   Ice is not a safe treatment option for people with:  Raynaud's phenomenon. This is a condition affecting small blood vessels in the extremities. Exposure to cold may cause your problems to return.     Cold hypersensitivity. There are many forms of cold hypersensitivity, including:     Cold urticaria. Red, itchy hives appear on the skin when the tissues begin to warm after being iced.     Cold erythema. This is a red, itchy rash caused by exposure to cold.     Cold hemoglobinuria. Red blood cells break down when the tissues begin to warm after being iced. The hemoglobin that carry oxygen are passed into the urine because they cannot combine with blood proteins fast enough.     Numbness or altered sensitivity in the area being iced.  If you have any of the following conditions, do not use ice until you have discussed cryotherapy with your caregiver:  Heart conditions, such as arrhythmia, angina, or chronic heart disease.     High blood pressure.     Healing wounds or open skin in the area being iced.     Current infections.     Rheumatoid arthritis.     Poor circulation.     Diabetes.  Ice slows the blood flow in the region it is applied. This is beneficial when trying to stop inflamed tissues from spreading irritating chemicals to surrounding tissues. However, if you expose your skin to cold temperatures for too long or without the proper protection, you can damage your skin or nerves. Watch for signs of skin damage due to  cold. HOME CARE INSTRUCTIONS Follow these tips to use ice and cold packs safely.  Place a dry or damp towel between the ice and skin. A damp towel will cool the skin more quickly, so you may need to shorten the time that the ice is used.     For a more rapid response, add gentle compression to the ice.     Ice for no more than 10 to 20 minutes at a time. The bonier the area you are icing, the less time it will take to get the benefits of ice.     Check your skin after 5 minutes to make sure there are no signs of a poor response to cold or skin damage.     Rest 20 minutes or more in between uses.     Once your skin is numb, you can end your treatment. You can test numbness by very lightly touching your skin. The touch should be so light that you do not see the skin dimple from the pressure of your fingertip. When using ice, most people will feel these normal sensations in this order: cold, burning, aching, and numbness.     Do not use ice on someone who cannot communicate their responses to pain, such as small children or people with dementia.  HOW TO MAKE AN ICE PACK Ice packs are the most common way to use ice  therapy. Other methods include ice massage, ice baths, and cryo-sprays. Muscle creams that cause a cold, tingly feeling do not offer the same benefits that ice offers and should not be used as a substitute unless recommended by your caregiver. To make an ice pack, do one of the following:  Place crushed ice or a bag of frozen vegetables in a sealable plastic bag. Squeeze out the excess air. Place this bag inside another plastic bag. Slide the bag into a pillowcase or place a damp towel between your skin and the bag.     Mix 3 parts water with 1 part rubbing alcohol. Freeze the mixture in a sealable plastic bag. When you remove the mixture from the freezer, it will be slushy. Squeeze out the excess air. Place this bag inside another plastic bag. Slide the bag into a pillowcase or place a  damp towel between your skin and the bag.  SEEK MEDICAL CARE IF:  You develop white spots on your skin. This may give the skin a blotchy (mottled) appearance.     Your skin turns blue or pale.     Your skin becomes waxy or hard.     Your swelling gets worse.  MAKE SURE YOU:    Understand these instructions.     Will watch your condition.     Will get help right away if you are not doing well or get worse.  Document Released: 10/09/2010 Document Reviewed: 10/05/2010 [facility] Patient Information 2012 [place], [place].

## 2011-04-10 LAB — GC/CHLAMYDIA PROBE AMP, URINE
Chlamydia, Swab/Urine, PCR: NEGATIVE
GC Probe Amp, Urine: NEGATIVE

## 2011-04-19 ENCOUNTER — Other Ambulatory Visit: Payer: Self-pay | Admitting: Family

## 2011-05-03 ENCOUNTER — Ambulatory Visit (INDEPENDENT_AMBULATORY_CARE_PROVIDER_SITE_OTHER): Payer: Self-pay | Admitting: Obstetrics & Gynecology

## 2011-05-03 ENCOUNTER — Encounter: Payer: Self-pay | Admitting: Obstetrics & Gynecology

## 2011-05-03 DIAGNOSIS — N949 Unspecified condition associated with female genital organs and menstrual cycle: Secondary | ICD-10-CM

## 2011-05-03 DIAGNOSIS — IMO0002 Reserved for concepts with insufficient information to code with codable children: Secondary | ICD-10-CM

## 2011-05-03 DIAGNOSIS — R87613 High grade squamous intraepithelial lesion on cytologic smear of cervix (HGSIL): Secondary | ICD-10-CM

## 2011-05-03 DIAGNOSIS — R102 Pelvic and perineal pain unspecified side: Secondary | ICD-10-CM

## 2011-05-03 DIAGNOSIS — N898 Other specified noninflammatory disorders of vagina: Secondary | ICD-10-CM

## 2011-05-03 MED ORDER — DICLOFENAC SODIUM 75 MG PO TBEC: 75.0000 mg | DELAYED_RELEASE_TABLET | Freq: Two times a day (BID) | ORAL | Status: AC

## 2011-05-03 NOTE — Patient Instructions (Signed)
Return to clinic for any gynecologic concerns or for scheduled pap smear

## 2011-05-03 NOTE — Progress Notes (Signed)
History:  46 y.o. [id number] here today for persistent yellow-orange discharge after her cryotherapy on 04/04/11 done for HGSIL pap. Also reports cramping.  Had low grade fever, but reports having URI symptoms.  No other GYN concerns.  The following portions of the patient's history were reviewed and updated as appropriate: allergies, current medications, past family history, past medical history, past social history, past surgical history and problem list.   Objective:  Physical Exam Blood pressure 136/91, pulse 96, temperature 98.8 F (37.1 C), temperature source Oral, height 5\' 5"  (1.651 m), weight 142 lb 1.6 oz (64.456 kg), last menstrual period 04/23/2011. Gen: NAD Abd: Soft, nontender and nondistended Pelvic: Normal appearing external genitalia; normal appearing vaginal mucosa. Yellow discharge noted in vault.  Cervical lesion after crytotherapy is healing well, had some friable areas noted on the surface.  GC/Chlam, wet prep samples obtained. Mild CMT. Small uterus, no palpable masses or adnexal tenderness.  Assessment & Plan:  Discharge normal after cryotherapy, but will follow up sample results and treat accordingly. Fever likely 2/2 URI.  Diclofenac DR prescription given.  Pain and bleeding precautions reviewed.

## 2011-05-04 LAB — WET PREP, GENITAL
Clue Cells Wet Prep HPF POC: NONE SEEN
Trich, Wet Prep: NONE SEEN
Yeast Wet Prep HPF POC: NONE SEEN

## 2011-05-04 LAB — GC/CHLAMYDIA PROBE AMP, GENITAL
Chlamydia, DNA Probe: NEGATIVE
GC Probe Amp, Genital: NEGATIVE

## 2012-04-17 ENCOUNTER — Other Ambulatory Visit (HOSPITAL_COMMUNITY): Payer: Self-pay | Admitting: Internal Medicine

## 2012-04-17 DIAGNOSIS — Z1231 Encounter for screening mammogram for malignant neoplasm of breast: Secondary | ICD-10-CM

## 2012-04-24 ENCOUNTER — Ambulatory Visit (HOSPITAL_COMMUNITY)
Admission: RE | Admit: 2012-04-24 | Discharge: 2012-04-24 | Disposition: A | Discharge status: Home / Self Care | Payer: Self-pay | Source: Ambulatory Visit | Attending: Internal Medicine | Admitting: Internal Medicine

## 2012-04-24 DIAGNOSIS — Z1231 Encounter for screening mammogram for malignant neoplasm of breast: Secondary | ICD-10-CM

## 2012-04-29 ENCOUNTER — Other Ambulatory Visit: Payer: Self-pay | Admitting: Internal Medicine

## 2012-04-29 DIAGNOSIS — R928 Other abnormal and inconclusive findings on diagnostic imaging of breast: Secondary | ICD-10-CM

## 2012-05-12 ENCOUNTER — Encounter (HOSPITAL_COMMUNITY): Payer: Self-pay | Admitting: *Deleted

## 2012-05-20 ENCOUNTER — Encounter (HOSPITAL_COMMUNITY): Payer: Self-pay

## 2012-05-20 ENCOUNTER — Ambulatory Visit (HOSPITAL_COMMUNITY)
Admission: RE | Admit: 2012-05-20 | Discharge: 2012-05-20 | Disposition: A | Discharge status: Home / Self Care | Payer: Self-pay | Source: Ambulatory Visit | Attending: Obstetrics and Gynecology | Admitting: Obstetrics and Gynecology

## 2012-05-20 VITALS — BP 112/70 | Temp 98.0°F | Ht 65.0 in | Wt 142.4 lb

## 2012-05-20 DIAGNOSIS — IMO0001 Reserved for inherently not codable concepts without codable children: Secondary | ICD-10-CM | POA: Insufficient documentation

## 2012-05-20 DIAGNOSIS — Z1239 Encounter for other screening for malignant neoplasm of breast: Secondary | ICD-10-CM

## 2012-05-20 NOTE — Patient Instructions (Addendum)
Taught patient how to perform BSE. Patient did not need a Pap smear today due to last Pap smear was 03/31/2012. Referred patient to the [facility] for colposcopy to follow up for abnormal Pap smear. Appointment scheduled for Monday, June 02, 2012 at 1400. Referred patient to the [facility] for left breast diagnostic mammogram and ultrasound per recommendation. Appointment scheduled for Wednesday, May 28, 2012 at 1510. Patient aware of appointments and will be there. Patient verbalized understanding.

## 2012-05-20 NOTE — Progress Notes (Signed)
Patient referred to [facility] due to needing additional imaging of the left breast. Screening mammogram completed at [facility] 04/24/2012. Patient came to the Free Cervical cancer screening at the [facility] on 03/31/2012 and had an abnormal Pap smear that a colposcopy is recommended for follow up.  Pap Smear:    Pap smear not completed today. Last Pap smear was 03/31/2012 at the free cervical cancer screening at the [facility] and ASCUS HPV+. Referred patient to the [facility] for colposcopy to follow up for abnormal Pap smear. Appointment scheduled for Monday, June 02, 2012 at 1400.  Per patient has a history of an abnormal Pap smear at the end of 2012 that required a colposcopy and cryo for follow up. Pap smear result is scanned in EPIC under media.  Physical exam: Breasts Breasts symmetrical. No skin abnormalities bilateral breasts. No nipple retraction bilateral breasts. No nipple discharge bilateral breasts. No lymphadenopathy. No lumps palpated bilateral breasts. No complaints of pain or tenderness on exam. Referred patient to the [facility] for left breast diagnostic mammogram and ultrasound per recommendation. Appointment scheduled for Wednesday, May 28, 2012 at 1510.       Pelvic/Bimanual No Pap smear completed today since last Pap smear was 03/31/2012. Pap smear not indicated per BCCCP guidelines.

## 2012-05-28 ENCOUNTER — Ambulatory Visit
Admission: RE | Admit: 2012-05-28 | Discharge: 2012-05-28 | Disposition: A | Discharge status: Home / Self Care | Payer: No Typology Code available for payment source | Source: Ambulatory Visit | Attending: Internal Medicine | Admitting: Internal Medicine

## 2012-05-28 DIAGNOSIS — R928 Other abnormal and inconclusive findings on diagnostic imaging of breast: Secondary | ICD-10-CM

## 2012-06-02 ENCOUNTER — Encounter: Payer: Self-pay | Admitting: Obstetrics & Gynecology

## 2012-06-02 ENCOUNTER — Ambulatory Visit (INDEPENDENT_AMBULATORY_CARE_PROVIDER_SITE_OTHER): Payer: Self-pay | Admitting: Obstetrics & Gynecology

## 2012-06-02 ENCOUNTER — Other Ambulatory Visit (HOSPITAL_COMMUNITY)
Admission: RE | Admit: 2012-06-02 | Discharge: 2012-06-02 | Disposition: A | Discharge status: Home / Self Care | Payer: Self-pay | Source: Ambulatory Visit | Attending: Obstetrics & Gynecology | Admitting: Obstetrics & Gynecology

## 2012-06-02 ENCOUNTER — Other Ambulatory Visit: Payer: Self-pay | Admitting: Obstetrics & Gynecology

## 2012-06-02 VITALS — BP 149/80 | HR 78 | Ht 64.0 in | Wt 142.7 lb

## 2012-06-02 DIAGNOSIS — R87811 Vaginal high risk human papillomavirus (HPV) DNA test positive: Secondary | ICD-10-CM

## 2012-06-02 DIAGNOSIS — N889 Noninflammatory disorder of cervix uteri, unspecified: Secondary | ICD-10-CM

## 2012-06-02 DIAGNOSIS — N72 Inflammatory disease of cervix uteri: Secondary | ICD-10-CM | POA: Insufficient documentation

## 2012-06-02 DIAGNOSIS — IMO0002 Reserved for concepts with insufficient information to code with codable children: Secondary | ICD-10-CM

## 2012-06-02 LAB — POCT PREGNANCY, URINE: Preg Test, Ur: NEGATIVE

## 2012-06-02 NOTE — Progress Notes (Signed)
  Subjective:    Patient ID: Andrea Petersen, female    DOB: 1966-02-20, 47 y.o.   MRN: [id number]  HPI  47 yo SW lady who is here for a colposcopy today. 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 She had a cryo in 1/13 and a follow up pap at the free clinic (cancer screening) 2/14. It showed ASCUS + HR HPV.  Review of Systems She is interested in [place] for birth control.    Objective:   Physical Exam 2 cervical ostia noted. I was able to canulate each with a Q tip (handle).   UPT negative, consent signed, time out done Cervix prepped with acetic acid. Transformation zone seen in its entirety. Colpo adequate. No evidence of dysplasia ECC obtained. She tolerated the procedure well.      Assessment & Plan:  2 ostia noted. I looked back at her previous notes by advanced practitioners and could not find this cervical abnormality noted. I will order an U/S to see if a Mullerian duct abnormality is present (ie. How many IUDs does she need). I spoke with a radiologist who says that a MRI may be needed in the future.

## 2012-06-03 ENCOUNTER — Encounter: Payer: Self-pay | Admitting: *Deleted

## 2012-06-18 ENCOUNTER — Ambulatory Visit (INDEPENDENT_AMBULATORY_CARE_PROVIDER_SITE_OTHER): Payer: Self-pay | Admitting: Obstetrics & Gynecology

## 2012-06-18 VITALS — BP 136/92 | HR 83 | Temp 98.3°F | Ht 65.5 in | Wt 140.0 lb

## 2012-06-18 DIAGNOSIS — IMO0002 Reserved for concepts with insufficient information to code with codable children: Secondary | ICD-10-CM

## 2012-06-18 DIAGNOSIS — R6889 Other general symptoms and signs: Secondary | ICD-10-CM

## 2012-06-18 NOTE — Progress Notes (Signed)
  Subjective:    Patient ID: Andrea Petersen, female    DOB: 10/24/1965, 47 y.o.   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 She had an ASCUS pap, +HPV, followed by a colpo 2 weeks ago here. She is here today for the results of her ECC. It was negative. She has not yet had her u/s because of insurance reasons. She will get insurance next month and already has an u/s scheduled.  Review of Systems     Objective:   Physical Exam        Assessment & Plan:  H/o abnormal paps- negative ECC. RTC 1 year for annual/pap Contraception- She does not want anything with hormones. She is considering a Copper T IUD, but I will need to find out if she has a uterine abnormality (awaiting u/s).

## 2012-06-30 ENCOUNTER — Ambulatory Visit (HOSPITAL_COMMUNITY)
Admission: RE | Admit: 2012-06-30 | Discharge: 2012-06-30 | Disposition: A | Discharge status: Home / Self Care | Payer: BC Managed Care – PPO | Source: Ambulatory Visit | Attending: Obstetrics & Gynecology | Admitting: Obstetrics & Gynecology

## 2012-06-30 DIAGNOSIS — N949 Unspecified condition associated with female genital organs and menstrual cycle: Secondary | ICD-10-CM | POA: Insufficient documentation

## 2012-06-30 DIAGNOSIS — N854 Malposition of uterus: Secondary | ICD-10-CM | POA: Insufficient documentation

## 2012-06-30 DIAGNOSIS — IMO0002 Reserved for concepts with insufficient information to code with codable children: Secondary | ICD-10-CM | POA: Insufficient documentation

## 2012-06-30 DIAGNOSIS — N938 Other specified abnormal uterine and vaginal bleeding: Secondary | ICD-10-CM | POA: Insufficient documentation

## 2012-07-30 ENCOUNTER — Other Ambulatory Visit: Payer: Self-pay | Admitting: Obstetrics & Gynecology

## 2012-07-30 ENCOUNTER — Ambulatory Visit (INDEPENDENT_AMBULATORY_CARE_PROVIDER_SITE_OTHER): Payer: BC Managed Care – PPO | Admitting: Obstetrics & Gynecology

## 2012-07-30 ENCOUNTER — Encounter: Payer: Self-pay | Admitting: Obstetrics & Gynecology

## 2012-07-30 VITALS — BP 137/95 | HR 82 | Ht 65.0 in | Wt 142.0 lb

## 2012-07-30 DIAGNOSIS — Z30430 Encounter for insertion of intrauterine contraceptive device: Secondary | ICD-10-CM

## 2012-07-30 DIAGNOSIS — Z975 Presence of (intrauterine) contraceptive device: Secondary | ICD-10-CM

## 2012-07-30 LAB — POCT PREGNANCY, URINE: Preg Test, Ur: NEGATIVE

## 2012-07-30 MED ORDER — PARAGARD INTRAUTERINE COPPER IU IUD
1.0000 [IU] | INTRAUTERINE_SYSTEM | Freq: Once | INTRAUTERINE | Status: AC
  Administered 2012-07-30: 1 [IU] via INTRAUTERINE

## 2012-07-30 NOTE — Addendum Note (Signed)
Addended by: Toula Moos on: 07/30/2012 03:15 PM   Modules accepted: Orders

## 2012-07-30 NOTE — Progress Notes (Signed)
  Subjective:    Patient ID: Andrea Petersen, female    DOB: 1965-07-01, 47 y.o.   MRN: [id number]  HPI  47 yo SW lady who wants a Paraguard IUD.  Review of Systems     Objective:   Physical Exam  UPT negative, consent signed, Time out procedure done. Cervix prepped with betadine and grasped with a single tooth tenaculum. Paraguard was easily placed and the strings were cut to 3-4 cm. Uterus sounded to 9 cm. She tolerated the procedure well.       Assessment & Plan:

## 2012-09-01 ENCOUNTER — Encounter: Payer: Self-pay | Admitting: *Deleted

## 2012-09-10 ENCOUNTER — Emergency Department (HOSPITAL_COMMUNITY)
Admission: EM | Admit: 2012-09-10 | Discharge: 2012-09-10 | Disposition: A | Discharge status: Home / Self Care | Payer: BC Managed Care – PPO | Source: Home / Self Care

## 2012-09-10 ENCOUNTER — Encounter (HOSPITAL_COMMUNITY): Payer: Self-pay | Admitting: *Deleted

## 2012-09-10 DIAGNOSIS — J069 Acute upper respiratory infection, unspecified: Secondary | ICD-10-CM

## 2012-09-10 DIAGNOSIS — J309 Allergic rhinitis, unspecified: Secondary | ICD-10-CM

## 2012-09-10 LAB — POCT RAPID STREP A: Streptococcus, Group A Screen (Direct): NEGATIVE

## 2012-09-10 NOTE — ED Notes (Signed)
C/o waking up with scratchy itchy throat yesterday morning.  Coughed up some phlegm.  Today it was yellowish green.  C/o slight burning in her sinuses and burning headache.

## 2012-09-10 NOTE — ED Provider Notes (Signed)
History    CSN: [id number] Arrival date & time 09/10/12  1652  None    Chief Complaint  Patient presents with  . URI   (Consider location/radiation/quality/duration/timing/severity/associated sxs/prior Treatment) HPI Comments: 34 her old female presents with a scratchy throat started yesterday. She also has a mild headache and itching burning in the ears. Positive for PND. She states that she does not feel really bad but feels like she is coming down with something and she started not to come in.  Past Medical History  Diagnosis Date  . Abnormal Pap smear   . 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 Ruptured lumbar disc     L5/S1   Past Surgical History  Procedure Laterality Date  . Back surgery      march 2004, dec 2004  . Cryotheraphy  04/04/11   Family History  Problem Relation Age of Onset  . Colon cancer Father   . COPD Father   . Breast cancer Mother   . Hypertension Mother   . Miscarriages / [place] Mother   . Hyperlipidemia Brother    History  Substance Use Topics  . Smoking status: Never Smoker   . Smokeless tobacco: Never Used  . Alcohol Use: 2.4 - 3 oz/week    4-5 Glasses of wine per week     Comment: occasional   OB History   Grav Para Term Preterm Abortions TAB SAB Ect Mult Living   2 2 2       2      Review of Systems  Constitutional: Positive for activity change. Negative for fever, chills, appetite change and fatigue.  HENT: Positive for sore throat, rhinorrhea and postnasal drip. Negative for congestion, facial swelling, neck pain and neck stiffness.   Eyes: Negative.   Respiratory: Negative.   Cardiovascular: Negative.   Gastrointestinal: Negative.   Skin: Negative for pallor and rash.  Neurological: Negative.     Allergies  Sulfa antibiotics  Home Medications   Current Outpatient Rx  Name  Route  Sig  Dispense  Refill  . aspirin 325 MG tablet   Oral   Take 650 mg by mouth every 4 (four)  hours as needed for pain.          . Cetirizine HCl (ZYRTEC PO)   Oral   Take 10 mg by mouth daily.           Marland Kitchen ibuprofen (ADVIL,MOTRIN) 200 MG tablet   Oral   Take 400 mg by mouth every 6 (six) hours as needed. Takes 2- 4 tabs depending on pain level         . guaiFENesin (MUCINEX) 600 MG 12 hr tablet   Oral   Take 400 mg by mouth 2 (two) times daily.         . naproxen sodium (ANAPROX) 220 MG tablet   Oral   Take 220 mg by mouth as needed.         . pseudoephedrine (SUDAFED) 120 MG 12 hr tablet   Oral   Take 120 mg by mouth every 12 (twelve) hours.          BP 142/86  Pulse 74  Temp(Src) 98.4 F (36.9 C) (Oral)  Resp 17  SpO2 100%  LMP 08/30/2012 Physical Exam  Nursing note and vitals reviewed. Constitutional: She is oriented to person, place, and time. She appears well-developed and well-nourished. No distress.  HENT:  Right Ear: External ear normal.  Left Ear:  External ear normal.  Oropharynx with minor light erythema to the left and the right. Central pharynx is clear. No exudates or swelling. Bilateral TMs are normal.  Eyes: Conjunctivae are normal.  Neck: Normal range of motion. Neck supple.  Positive anterior cervical lymphadenitis.  Cardiovascular: Normal rate and regular rhythm.   Pulmonary/Chest: Effort normal and breath sounds normal. No respiratory distress.  Musculoskeletal: Normal range of motion. She exhibits no edema.  Lymphadenopathy:    She has cervical adenopathy.  Neurological: She is alert and oriented to person, place, and time.  Skin: Skin is warm and dry. No rash noted.  Psychiatric: She has a normal mood and affect.    ED Course  Procedures (including critical care time) Labs Reviewed  CULTURE, GROUP A STREP  POCT RAPID STREP A ([facility]RG CARE ONLY)   No results found. 1. Allergic rhinitis   2. URI (upper respiratory infection)     MDM  Instructions on both URI and allergic rhinitis. Patient believes she has more of a  URI. Nevertheless the treatment would be the same in regards to symptoms relief. May take antihistamines and/or decongestants as needed. Drink plenty of fluids and stay well hydrated. Take Tylenol and/or ibuprofen if needed for discomfort. 3 new symptoms or problems or worsening may return or follow up with your primary care doctor. The throat tests will be cultured and if it turns out to be positive for strep call you and treat over the phone. Otherwise, I see no reason to treat with antibiotics at this time.  Hayden Rasmussen, NP 09/10/12 1757  Hayden Rasmussen, NP 09/11/12 1349

## 2012-09-11 NOTE — ED Provider Notes (Signed)
Medical screening examination/treatment/procedure(s) were performed by non-physician practitioner and as supervising physician I was immediately available for consultation/collaboration.  Raynald Blend, MD 09/11/12 [phone]

## 2012-09-12 LAB — CULTURE, GROUP A STREP

## 2013-01-01 ENCOUNTER — Other Ambulatory Visit: Payer: Self-pay

## 2013-10-13 ENCOUNTER — Other Ambulatory Visit: Payer: Self-pay

## 2013-10-14 ENCOUNTER — Ambulatory Visit (INDEPENDENT_AMBULATORY_CARE_PROVIDER_SITE_OTHER): Payer: BC Managed Care – PPO | Admitting: Obstetrics & Gynecology

## 2013-10-14 ENCOUNTER — Encounter: Payer: Self-pay | Admitting: Obstetrics & Gynecology

## 2013-10-14 VITALS — BP 137/90 | HR 82 | Temp 98.2°F | Ht 65.0 in | Wt 152.6 lb

## 2013-10-14 DIAGNOSIS — Z01419 Encounter for gynecological examination (general) (routine) without abnormal findings: Secondary | ICD-10-CM | POA: Diagnosis not present

## 2013-10-14 DIAGNOSIS — Z975 Presence of (intrauterine) contraceptive device: Secondary | ICD-10-CM | POA: Insufficient documentation

## 2013-10-14 HISTORY — DX: Presence of (intrauterine) contraceptive device: Z97.5

## 2013-10-14 NOTE — Patient Instructions (Signed)
Intrauterine Device Information An intrauterine device (IUD) is inserted into your uterus to prevent pregnancy. There are two types of IUDs available:   Copper IUD--This type of IUD is wrapped in copper wire and is placed inside the uterus. Copper makes the uterus and fallopian tubes produce a fluid that kills sperm. The copper IUD can stay in place for 10 years.  Hormone IUD--This type of IUD contains the hormone progestin (synthetic progesterone). The hormone thickens the cervical mucus and prevents sperm from entering the uterus. It also thins the uterine lining to prevent implantation of a fertilized egg. The hormone can weaken or kill the sperm that get into the uterus. One type of hormone IUD can stay in place for 5 years, and another type can stay in place for 3 years. Your health care provider will make sure you are a good candidate for a contraceptive IUD. Discuss with your health care provider the possible side effects.  ADVANTAGES OF AN INTRAUTERINE DEVICE  IUDs are highly effective, reversible, long acting, and low maintenance.   There are no estrogen-related side effects.   An IUD can be used when breastfeeding.   IUDs are not associated with weight gain.   The copper IUD works immediately after insertion.   The hormone IUD works right away if inserted within 7 days of your period starting. You will need to use a backup method of birth control for 7 days if the hormone IUD is inserted at any other time in your cycle.  The copper IUD does not interfere with your female hormones.   The hormone IUD can make heavy menstrual periods lighter and decrease cramping.   The hormone IUD can be used for 3 or 5 years.   The copper IUD can be used for 10 years. DISADVANTAGES OF AN INTRAUTERINE DEVICE  The hormone IUD can be associated with irregular bleeding patterns.   The copper IUD can make your menstrual flow heavier and more painful.   You may experience cramping and  vaginal bleeding after insertion.  Document Released: 01/17/2004 Document Revised: 10/15/2012 Document Reviewed: 08/03/2012 [employer] Patient Information 2015 [employer]. This information is not intended to replace advice given to you by your health care provider. Make sure you discuss any questions you have with your health care provider.  

## 2013-10-14 NOTE — Progress Notes (Signed)
Patient ID: Andrea Petersen, female   DOB: 13-Mar-1965, 48 y.o.   MRN: [id number]  Chief Complaint  Patient presents with  . Gynecologic Exam    HPI Andrea Petersen is a 48 y.o. female.  [id number] Patient's last menstrual period was 10/14/2013. Paragard in place, menses are a bit longer now but not heavy. Need f/u pap with h/o HSIL and cryo in 2013, ASCUS pos HR HPV 2014, neg colpo and ECC then  HPI  Past Medical History  Diagnosis Date  . Abnormal Pap smear   . 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 Ruptured lumbar disc     L5/S1    Past Surgical History  Procedure Laterality Date  . Back surgery      march 2004, dec 2004  . Cryotheraphy  04/04/11    Family History  Problem Relation Age of Onset  . Colon cancer Father   . COPD Father   . Breast cancer Mother   . Hypertension Mother   . Miscarriages / [place] Mother   . Cancer Mother   . Hyperlipidemia Brother     Social History History  Substance Use Topics  . Smoking status: Never Smoker   . Smokeless tobacco: Never Used  . Alcohol Use: 2.4 - 3.0 oz/week    4-5 Glasses of wine per week     Comment: occasional    Allergies  Allergen Reactions  . Sulfa Antibiotics Hives    Current Outpatient Prescriptions  Medication Sig Dispense Refill  . aspirin 325 MG tablet Take 650 mg by mouth every 4 (four) hours as needed for pain.       . Cetirizine HCl (ZYRTEC PO) Take 10 mg by mouth daily.        Marland Kitchen guaiFENesin (MUCINEX) 600 MG 12 hr tablet Take 400 mg by mouth 2 (two) times daily.      Marland Kitchen ibuprofen (ADVIL,MOTRIN) 200 MG tablet Take 400 mg by mouth every 6 (six) hours as needed. Takes 2- 4 tabs depending on pain level      . naproxen sodium (ANAPROX) 220 MG tablet Take 220 mg by mouth as needed.      . pseudoephedrine (SUDAFED) 120 MG 12 hr tablet Take 120 mg by mouth every 12 (twelve) hours.       No current facility-administered medications for this visit.    Review of  Systems Review of Systems  Constitutional: Negative.   Respiratory: Negative.   Gastrointestinal: Negative.   Genitourinary: Positive for vaginal bleeding (brown). Negative for vaginal discharge, menstrual problem and pelvic pain.    Blood pressure 137/90, pulse 82, temperature 98.2 F (36.8 C), temperature source Oral, height 5\' 5"  (1.651 m), weight 152 lb 9.6 oz (69.219 kg), last menstrual period 10/14/2013.  Physical Exam Physical Exam  Constitutional: She is oriented to person, place, and time. She appears well-developed. No distress.  Neck: Normal range of motion.  Pulmonary/Chest: Effort normal. No respiratory distress.  Breasts: breasts appear normal, no suspicious masses, no skin or nipple changes or axillary nodes.   Abdominal: Soft. She exhibits no distension. There is no tenderness.  Genitourinary: Vagina normal and uterus normal.  No mass, not tender. Pap done   Musculoskeletal: Normal range of motion.  Neurological: She is alert and oriented to person, place, and time.  Skin: Skin is warm and dry.  Psychiatric: She has a normal mood and affect. Her behavior is normal.    Data Reviewed Pap and biopsy  Assessment    Well-woman exam with history of HSIL, Paragard     Plan    Mammogram, f/u on pap and repeat in 1 year if negative         Fortune Brannigan 10/14/2013, 2:37 PM

## 2013-10-15 LAB — CYTOLOGY - PAP

## 2013-11-16 ENCOUNTER — Telehealth: Payer: Self-pay | Admitting: *Deleted

## 2013-11-16 NOTE — Telephone Encounter (Signed)
Matalyn called and left a message she came in for a pap and std screening 10/14/13 and has not gotten any results- would like a call with results.

## 2013-11-16 NOTE — Telephone Encounter (Addendum)
Called Andrea Petersen , unable to leave message- heard message voice mailbox is full and cannot accept messages at this time.  Per chart pap was negative for malignancie, hrhpr, gonorrhea , chlamydia.  State previous atypia from previous pap  not seen. ( ASCUS)

## 2013-11-17 NOTE — Telephone Encounter (Signed)
Called patient and informed her of results and that no changes were present on this pap smear. Patient verbalized understanding and asked why it is last year it was abnormal and no one did anything. Told patient that if the changes were low enough sometimes they will not do anything because they feel her cervix would return to normal on its own which is what happened for her. Patient verbalized understanding and had no other questions

## 2013-11-24 ENCOUNTER — Ambulatory Visit (HOSPITAL_COMMUNITY)
Admission: RE | Admit: 2013-11-24 | Discharge: 2013-11-24 | Disposition: A | Discharge status: Home / Self Care | Payer: BC Managed Care – PPO | Source: Ambulatory Visit | Attending: Obstetrics & Gynecology | Admitting: Obstetrics & Gynecology

## 2013-11-24 DIAGNOSIS — Z01419 Encounter for gynecological examination (general) (routine) without abnormal findings: Secondary | ICD-10-CM

## 2013-11-24 DIAGNOSIS — Z1231 Encounter for screening mammogram for malignant neoplasm of breast: Secondary | ICD-10-CM | POA: Insufficient documentation

## 2013-12-28 ENCOUNTER — Encounter: Payer: Self-pay | Admitting: Obstetrics & Gynecology

## 2014-01-31 ENCOUNTER — Emergency Department (INDEPENDENT_AMBULATORY_CARE_PROVIDER_SITE_OTHER)
Admission: EM | Admit: 2014-01-31 | Discharge: 2014-01-31 | Disposition: A | Discharge status: Home / Self Care | Payer: BC Managed Care – PPO | Source: Home / Self Care | Attending: Family Medicine | Admitting: Family Medicine

## 2014-01-31 ENCOUNTER — Encounter (HOSPITAL_COMMUNITY): Payer: Self-pay | Admitting: *Deleted

## 2014-01-31 DIAGNOSIS — M545 Low back pain, unspecified: Secondary | ICD-10-CM

## 2014-01-31 MED ORDER — CYCLOBENZAPRINE HCL 5 MG PO TABS: 5.0000 mg | ORAL_TABLET | Freq: Three times a day (TID) | ORAL | Status: DC | PRN

## 2014-01-31 MED ORDER — TRAMADOL HCL 50 MG PO TABS: 50.0000 mg | ORAL_TABLET | Freq: Four times a day (QID) | ORAL | Status: DC | PRN

## 2014-01-31 NOTE — ED Provider Notes (Signed)
Andrea Petersen is a 48 y.o. female who presents to Urgent Care today for patient has a 2 day history of bilateral low back pain. No radiating pain weakness or numbness. No bowel bladder problems or difficulty walking. The pain is moderate to severe. She has tried ibuprofen and Flexeril and a back brace which have helped. She has history of 2 prior back surgeries due to a ruptured disc.  Pain started after patient bent down to dry her legs after after taking a shower. She felt a pulling sensation.   Past Medical History  Diagnosis Date  . Abnormal Pap smear   . 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 Ruptured lumbar disc     L5/S1   Past Surgical History  Procedure Laterality Date  . Back surgery      march 2004, dec 2004  . Cryotheraphy  04/04/11   History  Substance Use Topics  . Smoking status: Never Smoker   . Smokeless tobacco: Never Used  . Alcohol Use: Yes     Comment: occasional   ROS as above Medications: No current facility-administered medications for this encounter.   Current Outpatient Prescriptions  Medication Sig Dispense Refill  . aspirin 325 MG tablet Take 650 mg by mouth every 4 (four) hours as needed for pain.     . cyclobenzaprine (FLEXERIL) 5 MG tablet Take 1-2 tablets (5-10 mg total) by mouth 3 (three) times daily as needed for muscle spasms. 60 tablet 0  . traMADol (ULTRAM) 50 MG tablet Take 1 tablet (50 mg total) by mouth every 6 (six) hours as needed. 15 tablet 0  . [DISCONTINUED] Cetirizine HCl (ZYRTEC PO) Take 10 mg by mouth daily.       Allergies  Allergen Reactions  . Sulfa Antibiotics Hives     Exam:  BP 150/83 mmHg  Pulse 85  Temp(Src) 97.5 F (36.4 C) (Oral)  Resp 16  SpO2 100%  LMP 01/11/2014 (Exact Date) Gen: Well NAD Back: Nontender to spinal midline.Bilateral lumbar paraspinals. Low back range of motion is limited by pain. Lotion strength is equal and normal bilaterally. Reflexes are equal and  normal bilaterally. Normal gait. Normal sensation throughout.  No results found for this or any previous visit (from the past 24 hour(s)). No results found.  Assessment and Plan: 48 y.o. female with lumbago due to myofascial disruption. Treatment with physical therapy, Flexeril and tramadol and ibuprofen. Follow up with sports medicine as needed.  Discussed warning signs or symptoms. Please see discharge instructions. Patient expresses understanding.     Rodolph BongEvan S Corey, MD 01/31/14 [id number]

## 2014-01-31 NOTE — Discharge Instructions (Signed)
Thank you for coming in today. Come back or go to the emergency room if you notice new weakness new numbness problems walking or bowel or bladder problems. Follow-up with Dr. Quitman LivingsSmith  sports medicine as needed. Attend physical therapy.    Back Exercises These exercises may help you when beginning to rehabilitate your injury. Your symptoms may resolve with or without further involvement from your physician, physical therapist or athletic trainer. While completing these exercises, remember:   Restoring tissue flexibility helps normal motion to return to the joints. This allows healthier, less painful movement and activity.  An effective stretch should be held for at least 30 seconds.  A stretch should never be painful. You should only feel a gentle lengthening or release in the stretched tissue. STRETCH - Extension, Prone on Elbows   Lie on your stomach on the floor, a bed will be too soft. Place your palms about shoulder width apart and at the height of your head.  Place your elbows under your shoulders. If this is too painful, stack pillows under your chest.  Allow your body to relax so that your hips drop lower and make contact more completely with the floor.  Hold this position for __________ seconds.  Slowly return to lying flat on the floor. Repeat __________ times. Complete this exercise __________ times per day.  RANGE OF MOTION - Extension, Prone Press Ups   Lie on your stomach on the floor, a bed will be too soft. Place your palms about shoulder width apart and at the height of your head.  Keeping your back as relaxed as possible, slowly straighten your elbows while keeping your hips on the floor. You may adjust the placement of your hands to maximize your comfort. As you gain motion, your hands will come more underneath your shoulders.  Hold this position __________ seconds.  Slowly return to lying flat on the floor. Repeat __________ times. Complete this exercise  __________ times per day.  RANGE OF MOTION- Quadruped, Neutral Spine   Assume a hands and knees position on a firm surface. Keep your hands under your shoulders and your knees under your hips. You may place padding under your knees for comfort.  Drop your head and point your tail bone toward the ground below you. This will round out your low back like an angry cat. Hold this position for __________ seconds.  Slowly lift your head and release your tail bone so that your back sags into a large arch, like an old horse.  Hold this position for __________ seconds.  Repeat this until you feel limber in your low back.  Now, find your "sweet spot." This will be the most comfortable position somewhere between the two previous positions. This is your neutral spine. Once you have found this position, tense your stomach muscles to support your low back.  Hold this position for __________ seconds. Repeat __________ times. Complete this exercise __________ times per day.  STRETCH - Flexion, Single Knee to Chest   Lie on a firm bed or floor with both legs extended in front of you.  Keeping one leg in contact with the floor, bring your opposite knee to your chest. Hold your leg in place by either grabbing behind your thigh or at your knee.  Pull until you feel a gentle stretch in your low back. Hold __________ seconds.  Slowly release your grasp and repeat the exercise with the opposite side. Repeat __________ times. Complete this exercise __________ times per day.  STRETCH -  Hamstrings, Standing  Stand or sit and extend your right / left leg, placing your foot on a chair or foot stool  Keeping a slight arch in your low back and your hips straight forward.  Lead with your chest and lean forward at the waist until you feel a gentle stretch in the back of your right / left knee or thigh. (When done correctly, this exercise requires leaning only a small distance.)  Hold this position for __________  seconds. Repeat __________ times. Complete this stretch __________ times per day. STRENGTHENING - Deep Abdominals, Pelvic Tilt   Lie on a firm bed or floor. Keeping your legs in front of you, bend your knees so they are both pointed toward the ceiling and your feet are flat on the floor.  Tense your lower abdominal muscles to press your low back into the floor. This motion will rotate your pelvis so that your tail bone is scooping upwards rather than pointing at your feet or into the floor.  With a gentle tension and even breathing, hold this position for __________ seconds. Repeat __________ times. Complete this exercise __________ times per day.  STRENGTHENING - Abdominals, Crunches   Lie on a firm bed or floor. Keeping your legs in front of you, bend your knees so they are both pointed toward the ceiling and your feet are flat on the floor. Cross your arms over your chest.  Slightly tip your chin down without bending your neck.  Tense your abdominals and slowly lift your trunk high enough to just clear your shoulder blades. Lifting higher can put excessive stress on the low back and does not further strengthen your abdominal muscles.  Control your return to the starting position. Repeat __________ times. Complete this exercise __________ times per day.  STRENGTHENING - Quadruped, Opposite UE/LE Lift   Assume a hands and knees position on a firm surface. Keep your hands under your shoulders and your knees under your hips. You may place padding under your knees for comfort.  Find your neutral spine and gently tense your abdominal muscles so that you can maintain this position. Your shoulders and hips should form a rectangle that is parallel with the floor and is not twisted.  Keeping your trunk steady, lift your right hand no higher than your shoulder and then your left leg no higher than your hip. Make sure you are not holding your breath. Hold this position __________  seconds.  Continuing to keep your abdominal muscles tense and your back steady, slowly return to your starting position. Repeat with the opposite arm and leg. Repeat __________ times. Complete this exercise __________ times per day. Document Released: 03/02/2005 Document Revised: 05/07/2011 Document Reviewed: 05/27/2008 [facility] Patient Information 2015 [place], [place]. This information is not intended to replace advice given to you by your health care provider. Make sure you discuss any questions you have with your health care provider.

## 2014-01-31 NOTE — ED Notes (Signed)
States got out of shower, bent over to dry legs and felt sudden onset severe low back pain.  Denies radiation of pain.  Has taken NSAIDs and 2 doses of her mother's cyclobenzaprine today, and had improvement.  Denies parasthesias.

## 2014-02-01 ENCOUNTER — Telehealth (HOSPITAL_COMMUNITY): Payer: Self-pay

## 2014-02-01 NOTE — ED Notes (Signed)
Dr Denyse Amasscorey reviewed chart and asked that patient bring paperwork for him to review.  Attempt to call patient and there was not an answer.  Message left for her to call us back

## 2014-02-09 ENCOUNTER — Ambulatory Visit: Payer: BC Managed Care – PPO | Admitting: Physical Therapy

## 2015-06-27 ENCOUNTER — Encounter: Payer: Self-pay | Admitting: Internal Medicine

## 2016-05-27 HISTORY — PX: OTHER SURGICAL HISTORY: SHX169

## 2016-06-14 ENCOUNTER — Encounter: Payer: Self-pay | Admitting: Gastroenterology

## 2016-06-25 ENCOUNTER — Encounter (HOSPITAL_COMMUNITY): Payer: Self-pay | Admitting: Emergency Medicine

## 2016-06-25 ENCOUNTER — Ambulatory Visit (HOSPITAL_COMMUNITY)
Admission: EM | Admit: 2016-06-25 | Discharge: 2016-06-25 | Disposition: A | Discharge status: Home / Self Care | Payer: 59 | Attending: Internal Medicine | Admitting: Internal Medicine

## 2016-06-25 DIAGNOSIS — M5432 Sciatica, left side: Secondary | ICD-10-CM

## 2016-06-25 DIAGNOSIS — M545 Low back pain: Secondary | ICD-10-CM

## 2016-06-25 MED ORDER — KETOROLAC TROMETHAMINE 30 MG/ML IJ SOLN
INTRAMUSCULAR | Status: AC
  Filled 2016-06-25: qty 1

## 2016-06-25 MED ORDER — KETOROLAC TROMETHAMINE 30 MG/ML IJ SOLN
30.0000 mg | Freq: Once | INTRAMUSCULAR | Status: AC
  Administered 2016-06-25: 30 mg via INTRAMUSCULAR

## 2016-06-25 MED ORDER — PREDNISONE 10 MG (21) PO TBPK: ORAL_TABLET | Freq: Every day | ORAL | 0 refills | Status: DC

## 2016-06-25 MED ORDER — CYCLOBENZAPRINE HCL 10 MG PO TABS: 10.0000 mg | ORAL_TABLET | Freq: Three times a day (TID) | ORAL | 0 refills | Status: AC | PRN

## 2016-06-25 NOTE — ED Triage Notes (Signed)
Pt is having left lower back pain that radiates down her left hip and leg since Friday.  Pt states she has been treated for this in the past and had surgery in 2004.

## 2016-06-25 NOTE — ED Provider Notes (Signed)
CSN: [id number]     Arrival date & time 06/25/16  1359 History   First MD Initiated Contact with Patient 06/25/16 1441     Chief Complaint  Patient presents with  . Back Pain   (Consider location/radiation/quality/duration/timing/severity/associated sxs/prior Treatment) Patient is c/o left lower back pain radiating down her left left to her left big toe.  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  She contacted her Neurosurgeon office.  She states the pain yesterday was like the pain she had back in 2005.  She took a few of her mother's tramadol and flexeril and it didn't help.   The history is provided by the patient.  Back Pain  Location:  Lumbar spine Quality:  Aching Radiates to:  L foot Pain severity:  Moderate Pain is:  Worse during the day Onset quality:  Sudden Duration:  4 days Timing:  Constant Progression:  Worsening Chronicity:  New Relieved by:  Nothing Worsened by:  Ambulation Ineffective treatments:  Muscle relaxants Associated symptoms: numbness, paresthesias and tingling     Past Medical History:  Diagnosis Date  . Abnormal Pap smear   . 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 Ruptured lumbar disc    L5/S1   Past Surgical History:  Procedure Laterality Date  . BACK SURGERY     march 2004, dec 2004  . cryotheraphy  04/04/11   Family History  Problem Relation Age of Onset  . Colon cancer Father   . COPD Father   . Breast cancer Mother   . Hypertension Mother   . Miscarriages / [place] Mother   . Cancer Mother   . Hyperlipidemia Brother    Social History  Substance Use Topics  . Smoking status: Never Smoker  . Smokeless tobacco: Never Used  . Alcohol use Yes     Comment: occasional   OB History    Gravida Para Term Preterm AB Living   SAB TAB Ectopic Multiple Live Births                 Review of Systems  Constitutional: Negative.   HENT: Negative.   Eyes: Negative.    Respiratory: Negative.   Cardiovascular: Negative.   Gastrointestinal: Negative.   Endocrine: Negative.   Genitourinary: Negative.   Musculoskeletal: Positive for back pain.  Skin: Negative.   Allergic/Immunologic: Negative.   Neurological: Positive for tingling, numbness and paresthesias.    Allergies  Sulfa antibiotics  Home Medications   Prior to Admission medications   Medication Sig Start Date End Date Taking? Authorizing Provider  aspirin 325 MG tablet Take 650 mg by mouth every 4 (four) hours as needed for pain.     Historical Provider, MD  cyclobenzaprine (FLEXERIL) 10 MG tablet Take 1 tablet (10 mg total) by mouth 3 (three) times daily as needed for muscle spasms. 06/25/16   Deatra Canter, FNP  cyclobenzaprine (FLEXERIL) 5 MG tablet Take 1-2 tablets (5-10 mg total) by mouth 3 (three) times daily as needed for muscle spasms. 01/31/14   Rodolph Bong, MD  predniSONE (STERAPRED UNI-PAK 21 TAB) 10 MG (21) TBPK tablet Take by mouth daily. Take 6 tabs by mouth daily  for 2 days, then 5 tabs for 2 days, then 4 tabs for 2 days, then 3 tabs for 2 days, 2 tabs for 2 days, then 1 tab by mouth daily for 2 days  06/25/16   Deatra Canter, FNP  traMADol (ULTRAM) 50 MG tablet Take 1 tablet (50 mg total) by mouth every 6 (six) hours as needed. 01/31/14   Rodolph Bong, MD   Meds Ordered and Administered this Visit   Medications  ketorolac (TORADOL) 30 MG/ML injection 30 mg (not administered)    BP (!) 139/91 (BP Location: Right Arm)   Pulse (!) 101   Temp 98.7 F (37.1 C) (Oral)   SpO2 100%  No data found.   Physical Exam  Constitutional: She appears well-developed and well-nourished.  HENT:  Head: Normocephalic and atraumatic.  Eyes: Conjunctivae and EOM are normal. Pupils are equal, round, and reactive to light.  Neck: Normal range of motion. Neck supple.  Cardiovascular: Normal rate, regular rhythm and normal heart sounds.   Pulmonary/Chest: Effort normal and breath sounds  normal.  Musculoskeletal: She exhibits tenderness.  TTP left lumbar paraspinous muscles.  Positive SLR left leg.  Nursing note and vitals reviewed.   Urgent Care Course     Procedures (including critical care time)  Labs Review Labs Reviewed - No data to display  Imaging Review No results found.   Visual Acuity Review  Right Eye Distance:   Left Eye Distance:   Bilateral Distance:    Right Eye Near:   Left Eye Near:    Bilateral Near:         MDM   1. Left sided sciatica   2. Sciatica of left side    Toradol  IM Sterapred dose pack DS #42 Flexeril  one po tid prn #30   Deatra Canter, FNP 06/25/16 1506    Deatra Canter, FNP 06/25/16 [phone]

## 2016-07-27 ENCOUNTER — Ambulatory Visit (AMBULATORY_SURGERY_CENTER): Payer: Self-pay

## 2016-07-27 VITALS — Ht 64.0 in | Wt 138.0 lb

## 2016-07-27 DIAGNOSIS — Z1211 Encounter for screening for malignant neoplasm of colon: Secondary | ICD-10-CM

## 2016-07-27 MED ORDER — NA SULFATE-K SULFATE-MG SULF 17.5-3.13-1.6 GM/177ML PO SOLN: 1.0000 | Freq: Once | ORAL | 0 refills | Status: AC

## 2016-07-27 NOTE — Progress Notes (Signed)
Denies allergies to eggs or soy products. Denies complication of anesthesia or sedation. Denies use of weight loss medication. Denies use of O2.   Emmi instructions given for colonoscopy.  

## 2016-07-31 ENCOUNTER — Encounter: Payer: Self-pay | Admitting: Gastroenterology

## 2016-08-10 ENCOUNTER — Ambulatory Visit (AMBULATORY_SURGERY_CENTER): Payer: 59 | Admitting: Gastroenterology

## 2016-08-10 ENCOUNTER — Telehealth: Payer: Self-pay

## 2016-08-10 ENCOUNTER — Encounter: Payer: Self-pay | Admitting: Gastroenterology

## 2016-08-10 ENCOUNTER — Telehealth: Payer: Self-pay | Admitting: *Deleted

## 2016-08-10 VITALS — BP 156/93 | HR 84 | Temp 98.4°F | Resp 14 | Ht 64.0 in | Wt 138.0 lb

## 2016-08-10 DIAGNOSIS — Z1211 Encounter for screening for malignant neoplasm of colon: Secondary | ICD-10-CM | POA: Diagnosis not present

## 2016-08-10 DIAGNOSIS — Z1212 Encounter for screening for malignant neoplasm of rectum: Secondary | ICD-10-CM | POA: Diagnosis not present

## 2016-08-10 MED ORDER — SODIUM CHLORIDE 0.9 % IV SOLN: 500.0000 mL | INTRAVENOUS | Status: AC

## 2016-08-10 NOTE — Telephone Encounter (Signed)
Phone call to patient---Dr. Lavon PaganiniNandigam was paged from answering service. Pt states she vomited second part of prep this morning, states that bowel movements are lightly cloudy yellow. Encouraged patient to drink water until 830 am.

## 2016-08-10 NOTE — Op Note (Signed)
[facility] Patient Name: Andrea Petersen Procedure Date: 08/10/2016 11:26 AM MRN: [id number] Endoscopist: Napoleon Form , MD Age: 51 Referring MD:  Date of Birth: [birth year] Gender: Female Account #: [id number] Procedure:                Colonoscopy Indications:              Screening in patient at increased risk: Colorectal                            cancer in father 77 or older Medicines:                Monitored Anesthesia Care Procedure:                Pre-Anesthesia Assessment:                           - Prior to the procedure, a History and Physical                            was performed, and patient medications and                            allergies were reviewed. The patient's tolerance of                            previous anesthesia was also reviewed. The risks                            and benefits of the procedure and the sedation                            options and risks were discussed with the patient.                            All questions were answered, and informed consent                            was obtained. Prior Anticoagulants: The patient has                            taken no previous anticoagulant or antiplatelet                            agents. ASA Grade Assessment: II - A patient with                            mild systemic disease. After reviewing the risks                            and benefits, the patient was deemed in                            satisfactory condition to undergo the procedure.  After obtaining informed consent, the colonoscope                            was passed under direct vision. Throughout the                            procedure, the patient's blood pressure, pulse, and                            oxygen saturations were monitored continuously. The                            Colonoscope was introduced through the anus and                            advanced to the the  cecum, identified by                            appendiceal orifice and ileocecal valve. The                            colonoscopy was performed without difficulty. The                            patient tolerated the procedure well. The quality                            of the bowel preparation was excellent. The                            ileocecal valve, appendiceal orifice, and rectum                            were photographed. Scope In: 11:32:25 AM Scope Out: 11:47:28 AM Scope Withdrawal Time: 0 hours 10 minutes 7 seconds  Total Procedure Duration: 0 hours 15 minutes 3 seconds  Findings:                 Non-bleeding internal hemorrhoids were found during                            retroflexion. The hemorrhoids were small.                           The exam was otherwise without abnormality. Complications:            No immediate complications. Estimated Blood Loss:     Estimated blood loss: none. Impression:               - Non-bleeding internal hemorrhoids.                           - The examination was otherwise normal.                           - No specimens collected. Recommendation:           -  Patient has a contact number available for                            emergencies. The signs and symptoms of potential                            delayed complications were discussed with the                            patient. Return to normal activities tomorrow.                            Written discharge instructions were provided to the                            patient.                           - Resume previous diet.                           - Continue present medications.                           - No aspirin, ibuprofen, naproxen, or other                            non-steroidal anti-inflammatory drugs.                           - Repeat colonoscopy in 5 years for surveillance.                           - Return to GI clinic PRN. Napoleon FormKavitha V. Nandigam, MD 08/10/2016  11:57:08 AM This report has been signed electronically.

## 2016-08-10 NOTE — Progress Notes (Signed)
A and O x3. Report to RN. Tolerated MAC anesthesia well.

## 2016-08-10 NOTE — Progress Notes (Signed)
Pt's states no medical or surgical changes since previsit or office visit. 

## 2016-08-10 NOTE — Telephone Encounter (Signed)
Called pt (Dr Lavon PaganiniNandigam wanted us to discuss risks involved with taking too many NSAIDS) and discussed importance of not over-taking NSAIDS.  Pt voiced understanding. Andrea Petersen/ Recovery Room

## 2016-08-10 NOTE — Patient Instructions (Signed)
YOU HAD AN ENDOSCOPIC PROCEDURE TODAY AT THE [facility]:   Refer to the procedure report that was given to you for any specific questions about what was found during the examination.  If the procedure report does not answer your questions, please call your gastroenterologist to clarify.  If you requested that your care partner not be given the details of your procedure findings, then the procedure report has been included in a sealed envelope for you to review at your convenience later.  YOU SHOULD EXPECT: Some feelings of bloating in the abdomen. Passage of more gas than usual.  Walking can help get rid of the air that was put into your GI tract during the procedure and reduce the bloating. If you had a lower endoscopy (such as a colonoscopy or flexible sigmoidoscopy) you may notice spotting of blood in your stool or on the toilet paper. If you underwent a bowel prep for your procedure, you may not have a normal bowel movement for a few days.  Please Note:  You might notice some irritation and congestion in your nose or some drainage.  This is from the oxygen used during your procedure.  There is no need for concern and it should clear up in a day or so.  SYMPTOMS TO REPORT IMMEDIATELY:   Following lower endoscopy (colonoscopy or flexible sigmoidoscopy):  Excessive amounts of blood in the stool  Significant tenderness or worsening of abdominal pains  Swelling of the abdomen that is new, acute  Fever of 100F or higher    For urgent or emergent issues, a gastroenterologist can be reached at any hour by calling [phone].   DIET:  We do recommend a small meal at first, but then you may proceed to your regular diet.  Drink plenty of fluids but you should avoid alcoholic beverages for 24 hours.  ACTIVITY:  You should plan to take it easy for the rest of today and you should NOT DRIVE or use heavy machinery until tomorrow (because of the sedation medicines used during the test).     FOLLOW UP: Our staff will call the number listed on your records the next business day following your procedure to check on you and address any questions or concerns that you may have regarding the information given to you following your procedure. If we do not reach you, we will leave a message.  However, if you are feeling well and you are not experiencing any problems, there is no need to return our call.  We will assume that you have returned to your regular daily activities without incident.  If any biopsies were taken you will be contacted by phone or by letter within the next 1-3 weeks.  Please call us at [phone] if you have not heard about the biopsies in 3 weeks.    SIGNATURES/CONFIDENTIALITY: You and/or your care partner have signed paperwork which will be entered into your electronic medical record.  These signatures attest to the fact that that the information above on your After Visit Summary has been reviewed and is understood.  Full responsibility of the confidentiality of this discharge information lies with you and/or your care-partner   NO ASPIRIN,IBUPROFEN,NAPROXEN ,OR OTHER NON STEROIDAL ANTI INFLAMMATORY DRUGS  INFORMATION ON HEMORRHOIDS GIVEN TO YOU TODAY  .

## 2016-08-13 ENCOUNTER — Telehealth: Payer: Self-pay | Admitting: *Deleted

## 2016-08-13 ENCOUNTER — Telehealth: Payer: Self-pay

## 2016-08-13 NOTE — Telephone Encounter (Signed)
  Follow up Call-  Call back number 08/10/2016  Post procedure Call Back phone  # [phone]  Permission to leave phone message Yes  Some recent data might be hidden     Patient questions:  Do you have a fever, pain , or abdominal swelling? No. Pain Score  0 *  Have you tolerated food without any problems? Yes.    Have you been able to return to your normal activities? Yes.    Do you have any questions about your discharge instructions: Diet   No. Medications  No. Follow up visit  No.  Do you have questions or concerns about your Care? No.  Actions: * If pain score is 4 or above: No action needed, pain <4.

## 2016-08-13 NOTE — Telephone Encounter (Signed)
  No name or number identifier, left message. 

## 2016-12-12 ENCOUNTER — Other Ambulatory Visit: Payer: Self-pay | Admitting: *Deleted

## 2017-02-28 DIAGNOSIS — G894 Chronic pain syndrome: Secondary | ICD-10-CM

## 2017-02-28 HISTORY — DX: Chronic pain syndrome: G89.4

## 2017-03-18 DIAGNOSIS — Z79891 Long term (current) use of opiate analgesic: Secondary | ICD-10-CM

## 2017-03-18 HISTORY — DX: Long term (current) use of opiate analgesic: Z79.891

## 2017-05-29 DIAGNOSIS — M961 Postlaminectomy syndrome, not elsewhere classified: Secondary | ICD-10-CM

## 2017-05-29 HISTORY — DX: Postlaminectomy syndrome, not elsewhere classified: M96.1

## 2017-06-24 ENCOUNTER — Other Ambulatory Visit (HOSPITAL_COMMUNITY): Payer: Self-pay | Admitting: Radiology

## 2017-06-24 DIAGNOSIS — E049 Nontoxic goiter, unspecified: Secondary | ICD-10-CM

## 2017-06-26 ENCOUNTER — Ambulatory Visit (HOSPITAL_COMMUNITY): Payer: 59

## 2019-05-22 ENCOUNTER — Ambulatory Visit: Payer: 59 | Attending: Internal Medicine

## 2021-01-27 ENCOUNTER — Other Ambulatory Visit: Payer: Self-pay | Admitting: Obstetrics and Gynecology

## 2021-01-27 DIAGNOSIS — R928 Other abnormal and inconclusive findings on diagnostic imaging of breast: Secondary | ICD-10-CM

## 2021-03-23 ENCOUNTER — Ambulatory Visit: Admission: RE | Admit: 2021-03-23 | Payer: 59 | Source: Ambulatory Visit

## 2021-03-23 ENCOUNTER — Ambulatory Visit
Admission: RE | Admit: 2021-03-23 | Discharge: 2021-03-23 | Disposition: A | Discharge status: Home / Self Care | Payer: BC Managed Care – PPO | Source: Ambulatory Visit | Attending: Obstetrics and Gynecology | Admitting: Obstetrics and Gynecology

## 2021-03-23 DIAGNOSIS — R928 Other abnormal and inconclusive findings on diagnostic imaging of breast: Secondary | ICD-10-CM

## 2021-04-30 ENCOUNTER — Other Ambulatory Visit: Payer: Self-pay

## 2021-04-30 ENCOUNTER — Emergency Department (HOSPITAL_BASED_OUTPATIENT_CLINIC_OR_DEPARTMENT_OTHER)
Admission: EM | Admit: 2021-04-30 | Discharge: 2021-04-30 | Disposition: A | Discharge status: Home / Self Care | Payer: BC Managed Care – PPO | Attending: Emergency Medicine | Admitting: Emergency Medicine

## 2021-04-30 ENCOUNTER — Encounter (HOSPITAL_BASED_OUTPATIENT_CLINIC_OR_DEPARTMENT_OTHER): Payer: Self-pay | Admitting: Emergency Medicine

## 2021-04-30 ENCOUNTER — Emergency Department (HOSPITAL_BASED_OUTPATIENT_CLINIC_OR_DEPARTMENT_OTHER): Payer: BC Managed Care – PPO

## 2021-04-30 DIAGNOSIS — R61 Generalized hyperhidrosis: Secondary | ICD-10-CM | POA: Diagnosis not present

## 2021-04-30 DIAGNOSIS — R221 Localized swelling, mass and lump, neck: Secondary | ICD-10-CM | POA: Diagnosis not present

## 2021-04-30 DIAGNOSIS — R0600 Dyspnea, unspecified: Secondary | ICD-10-CM | POA: Insufficient documentation

## 2021-04-30 DIAGNOSIS — Z7982 Long term (current) use of aspirin: Secondary | ICD-10-CM | POA: Insufficient documentation

## 2021-04-30 DIAGNOSIS — Z20822 Contact with and (suspected) exposure to covid-19: Secondary | ICD-10-CM | POA: Insufficient documentation

## 2021-04-30 DIAGNOSIS — R0602 Shortness of breath: Secondary | ICD-10-CM | POA: Diagnosis present

## 2021-04-30 LAB — RESP PANEL BY RT-PCR (FLU A&B, COVID) ARPGX2
Influenza A by PCR: NEGATIVE
Influenza B by PCR: NEGATIVE
SARS Coronavirus 2 by RT PCR: NEGATIVE

## 2021-04-30 NOTE — ED Provider Notes (Signed)
?[facility] EMERGENCY DEPARTMENT ?Provider Note ? ? ?CSN: [id number] ?Arrival date & time: 04/30/21  1621 ? ?  ? ?History ? ?Chief Complaint  ?Patient presents with  ? Shortness of Breath  ? ? ?Andrea Petersen is a 56 y.o. female. ? ?Patient with a complaint of exertional shortness of breath for 2 days.  Patient is not having night sweats for several months.  Patient feels as if she has some slight swelling in the anterior part of her neck.  She is worried about her thyroid.  Patient did have an upper respiratory infection last week.  Vital signs here on presentation temp 99 heart rate 100 oxygen saturations very good at 99%.  Blood pressure elevated at 146/98.  Past medical history significant for an abnormal Pap smear and 2012.  Patient has been seen by GYN recently but they did not do any lab work.  And patient has a history of depression.  Patient denies any leg swelling.  Denies any chest pain.  No shortness of breath at rest. ? ? ?  ? ?Home Medications ?Prior to Admission medications   ?Medication Sig Start Date End Date Taking? Authorizing Provider  ?aspirin 325 MG tablet Take 650 mg by mouth every 4 (four) hours as needed for pain.     [provider]  ?cyclobenzaprine (FLEXERIL) 10 MG tablet Take 1 tablet (10 mg total) by mouth 3 (three) times daily as needed for muscle spasms. 06/25/16   Deatra Canter, FNP  ?HYDROcodone-acetaminophen (NORCO) 10-325 MG tablet Take 1 tablet by mouth every 6 (six) hours as needed.    [provider]  ?ibuprofen (ADVIL,MOTRIN) 800 MG tablet Take 800 mg by mouth every 8 (eight) hours as needed.    [provider]  ?Cetirizine HCl (ZYRTEC PO) Take 10 mg by mouth daily.    01/31/14  [provider]  ?   ? ?Allergies    ?Sulfa antibiotics   ? ?Review of Systems   ?Review of Systems  ?Constitutional:  Negative for chills and fever.  ?HENT:  Negative for congestion, ear pain, sore throat and trouble swallowing.   ?Eyes:  Negative for  pain and visual disturbance.  ?Respiratory:  Positive for shortness of breath. Negative for cough.   ?Cardiovascular:  Negative for chest pain and palpitations.  ?Gastrointestinal:  Negative for abdominal pain and vomiting.  ?Genitourinary:  Negative for dysuria and hematuria.  ?Musculoskeletal:  Negative for arthralgias and back pain.  ?Skin:  Negative for color change and rash.  ?Neurological:  Negative for seizures and syncope.  ?All other systems reviewed and are negative. ? ?Physical Exam ?Updated Vital Signs ?BP (!) 154/93 (BP Location: Right Arm)   Pulse 100   Temp 99 ?F (37.2 ?C) (Oral)   Resp 17   Ht 1.626 m (5\' 4" )   Wt 63.5 kg   LMP 01/11/2014 (Exact Date)   SpO2 99%   BMI 24.03 kg/m?  ?Physical Exam ?Vitals and nursing note reviewed.  ?Constitutional:   ?   General: She is not in acute distress. ?   Appearance: Normal appearance. She is well-developed.  ?HENT:  ?   Head: Normocephalic and atraumatic.  ?Eyes:  ?   Extraocular Movements: Extraocular movements intact.  ?   Conjunctiva/sclera: Conjunctivae normal.  ?   Pupils: Pupils are equal, round, and reactive to light.  ?Cardiovascular:  ?   Rate and Rhythm: Normal rate and regular rhythm.  ?   Heart sounds: No murmur heard. ?Pulmonary:  ?  Effort: Pulmonary effort is normal. No respiratory distress.  ?   Breath sounds: Normal breath sounds. No stridor. No wheezing, rhonchi or rales.  ?Abdominal:  ?   Palpations: Abdomen is soft.  ?   Tenderness: There is no abdominal tenderness.  ?Musculoskeletal:     ?   General: No swelling.  ?   Cervical back: Normal range of motion and neck supple.  ?   Right lower leg: No edema.  ?   Left lower leg: No edema.  ?Skin: ?   General: Skin is warm and dry.  ?   Capillary Refill: Capillary refill takes less than 2 seconds.  ?Neurological:  ?   General: No focal deficit present.  ?   Mental Status: She is alert and oriented to person, place, and time.  ?   Cranial Nerves: No cranial nerve deficit.  ?   Sensory:  No sensory deficit.  ?   Motor: No weakness.  ?   Coordination: Coordination normal.  ?Psychiatric:     ?   Mood and Affect: Mood normal.  ? ? ?ED Results / Procedures / Treatments   ?Labs ?(all labs ordered are listed, but only abnormal results are displayed) ?Labs Reviewed  ?RESP PANEL BY RT-PCR (FLU A&B, COVID) [id number]  ? ? ?EKG ?EKG Interpretation ? ?Date/Time:  Sunday April 30 2021 16:30:21 EST ?Ventricular Rate:  97 ?PR Interval:  120 ?QRS Duration: 82 ?QT Interval:  308 ?QTC Calculation: 391 ?R Axis:   75 ?Text Interpretation: Sinus rhythm with marked sinus arrhythmia Nonspecific ST abnormality Abnormal ECG No previous ECGs available Confirmed by Vanetta Mulders [phone]) on 04/30/2021 8:10:56 PM ? ?Radiology ?DG Chest 2 View ? ?Result Date: 04/30/2021 ?CLINICAL DATA:  Shortness of breath. EXAM: CHEST - 2 VIEW COMPARISON:  None. FINDINGS: The heart size and mediastinal contours are within normal limits. Both lungs are clear. The visualized skeletal structures are unremarkable. IMPRESSION: No active cardiopulmonary disease. Electronically Signed   By: Darliss Cheney M.D.   On: 04/30/2021 16:56   ? ?Procedures ?Procedures  ? ? ?Medications Ordered in ED ?Medications - No data to display ? ?ED Course/ Medical Decision Making/ A&P ?  ?                        ?Medical Decision Making ?Amount and/or Complexity of Data Reviewed ?Radiology: ordered. ? ? ?Patient certainly needs close follow-up with primary care physician.  Did offer to do basic labs here and consideration for CT angio chest.  Patient unfortunately had a very long wait up to this time.  But we do know so far is that EKG was sinus arrhythmia with a heart rate of 97 nonspecific ST changes.  Patient has no chest pain whatsoever.  COVID and influenza testing is negative chest x-ray negative ? ?Patient states she will follow-up with her primary care provider Maurice Small for further evaluation including evaluation of thyroid.  She does not want to wait tonight  to have that lab work done. ? ?Without the lab work cannot tell whether is any significant anemia.  Have not completely ruled out pulmonary embolus although her oxygen sats are very good in the upper 90s.  And no shortness of breath at rest.  Also with night sweats which could be just hormonal.  It would have been helpful to have a CBC with differential. ? ? ?Final Clinical Impression(s) / ED Diagnoses ?Final diagnoses:  ?Dyspnea, unspecified type  ?Night sweats  ? ? ?  Rx / DC Orders ?ED Discharge Orders   ? ? None  ? ?  ? ? ?  ?Vanetta Mulders, MD ?04/30/21 2035 ? ?

## 2021-04-30 NOTE — Discharge Instructions (Signed)
As we discussed offered to do additional work-up with labs here and consideration for CT of the chest.  Would return for any new or worse symptoms.  Would definitely make an appointment to follow-up with your primary care doctor.  They can do the lab work and they can also evaluate your thyroid. ?

## 2021-04-30 NOTE — ED Triage Notes (Signed)
Pt arrives pov, steady gait to triage, endorses "labored breathing" starting yesterday. Pt able tos speak in complete sentences. Reports "cold last week", neg home test.  ?

## 2021-09-21 ENCOUNTER — Encounter: Payer: Self-pay | Admitting: Gastroenterology

## 2022-03-29 DIAGNOSIS — I10 Essential (primary) hypertension: Secondary | ICD-10-CM

## 2022-03-29 HISTORY — DX: Essential (primary) hypertension: I10

## 2022-10-18 ENCOUNTER — Emergency Department (HOSPITAL_BASED_OUTPATIENT_CLINIC_OR_DEPARTMENT_OTHER): Payer: BC Managed Care – PPO

## 2022-10-18 ENCOUNTER — Encounter (HOSPITAL_BASED_OUTPATIENT_CLINIC_OR_DEPARTMENT_OTHER): Payer: Self-pay | Admitting: Urology

## 2022-10-18 ENCOUNTER — Emergency Department (HOSPITAL_BASED_OUTPATIENT_CLINIC_OR_DEPARTMENT_OTHER)
Admission: EM | Admit: 2022-10-18 | Discharge: 2022-10-18 | Disposition: A | Discharge status: Home / Self Care | Payer: BC Managed Care – PPO | Attending: Emergency Medicine | Admitting: Emergency Medicine

## 2022-10-18 ENCOUNTER — Other Ambulatory Visit: Payer: Self-pay

## 2022-10-18 DIAGNOSIS — R0600 Dyspnea, unspecified: Secondary | ICD-10-CM | POA: Insufficient documentation

## 2022-10-18 DIAGNOSIS — E876 Hypokalemia: Secondary | ICD-10-CM | POA: Insufficient documentation

## 2022-10-18 DIAGNOSIS — R112 Nausea with vomiting, unspecified: Secondary | ICD-10-CM

## 2022-10-18 DIAGNOSIS — E871 Hypo-osmolality and hyponatremia: Secondary | ICD-10-CM | POA: Insufficient documentation

## 2022-10-18 DIAGNOSIS — R519 Headache, unspecified: Secondary | ICD-10-CM | POA: Diagnosis present

## 2022-10-18 DIAGNOSIS — R531 Weakness: Secondary | ICD-10-CM | POA: Diagnosis not present

## 2022-10-18 LAB — URINALYSIS, ROUTINE W REFLEX MICROSCOPIC
Bilirubin Urine: NEGATIVE
Glucose, UA: NEGATIVE mg/dL
Hgb urine dipstick: NEGATIVE
Ketones, ur: >=80 mg/dL — AB
Leukocytes,Ua: NEGATIVE
Nitrite: NEGATIVE
Protein, ur: NEGATIVE mg/dL
Specific Gravity, Urine: 1.02 (ref 1.005–1.030)
pH: 6 (ref 5.0–8.0)

## 2022-10-18 LAB — COMPREHENSIVE METABOLIC PANEL
ALT: 21 U/L (ref 0–44)
AST: 20 U/L (ref 15–41)
Albumin: 4.9 g/dL (ref 3.5–5.0)
Alkaline Phosphatase: 60 U/L (ref 38–126)
Anion gap: 20 — ABNORMAL HIGH (ref 5–15)
BUN: 11 mg/dL (ref 6–20)
CO2: 25 mmol/L (ref 22–32)
Calcium: 9.3 mg/dL (ref 8.9–10.3)
Chloride: 84 mmol/L — ABNORMAL LOW (ref 98–111)
Creatinine, Ser: 0.96 mg/dL (ref 0.44–1.00)
GFR, Estimated: >60 mL/min (ref >60)
Glucose, Bld: 84 mg/dL (ref 70–99)
Potassium: 2.5 mmol/L — CL (ref 3.5–5.1)
Sodium: 129 mmol/L — ABNORMAL LOW (ref 135–145)
Total Bilirubin: 1.4 mg/dL — ABNORMAL HIGH (ref 0.3–1.2)
Total Protein: 8.8 g/dL — ABNORMAL HIGH (ref 6.5–8.1)

## 2022-10-18 LAB — CBC
HCT: 39.2 % (ref 36.0–46.0)
Hemoglobin: 14.7 g/dL (ref 12.0–15.0)
MCH: 32.6 pg (ref 26.0–34.0)
MCHC: 37.5 g/dL — ABNORMAL HIGH (ref 30.0–36.0)
MCV: 86.9 fL (ref 80.0–100.0)
Platelets: 451 10*3/uL — ABNORMAL HIGH (ref 150–400)
RBC: 4.51 MIL/uL (ref 3.87–5.11)
RDW: 11.8 % (ref 11.5–15.5)
WBC: 12.6 10*3/uL — ABNORMAL HIGH (ref 4.0–10.5)
nRBC: 0 % (ref 0.0–0.2)

## 2022-10-18 LAB — MAGNESIUM: Magnesium: 2.1 mg/dL (ref 1.7–2.4)

## 2022-10-18 LAB — LIPASE, BLOOD: Lipase: 25 U/L (ref 11–51)

## 2022-10-18 LAB — TROPONIN I (HIGH SENSITIVITY): Troponin I (High Sensitivity): 9 ng/L (ref <18)

## 2022-10-18 LAB — D-DIMER, QUANTITATIVE: D-Dimer, Quant: 0.39 ug{FEU}/mL (ref 0.00–0.50)

## 2022-10-18 MED ORDER — ONDANSETRON HCL 4 MG/2ML IJ SOLN
4.0000 mg | Freq: Once | INTRAMUSCULAR | Status: AC
Start: 2022-10-18 — End: 2022-10-18
  Administered 2022-10-18: 4 mg via INTRAVENOUS
  Filled 2022-10-18: qty 2

## 2022-10-18 MED ORDER — LACTATED RINGERS IV BOLUS
1000.0000 mL | Freq: Once | INTRAVENOUS | Status: AC
  Administered 2022-10-18: 1000 mL via INTRAVENOUS

## 2022-10-18 MED ORDER — PROMETHAZINE HCL 25 MG PO TABS: 25.0000 mg | ORAL_TABLET | Freq: Four times a day (QID) | ORAL | 0 refills | Status: DC | PRN

## 2022-10-18 MED ORDER — POTASSIUM CHLORIDE 10 MEQ/100ML IV SOLN
10.0000 meq | Freq: Once | INTRAVENOUS | Status: AC
  Administered 2022-10-18: 10 meq via INTRAVENOUS
  Filled 2022-10-18: qty 100

## 2022-10-18 MED ORDER — POTASSIUM CHLORIDE 10 MEQ/100ML IV SOLN
10.0000 meq | INTRAVENOUS | Status: AC
  Administered 2022-10-18 (×2): 10 meq via INTRAVENOUS
  Filled 2022-10-18: qty 100

## 2022-10-18 MED ORDER — POTASSIUM CHLORIDE CRYS ER 20 MEQ PO TBCR: 20.0000 meq | EXTENDED_RELEASE_TABLET | Freq: Two times a day (BID) | ORAL | 0 refills | Status: DC

## 2022-10-18 MED ORDER — POTASSIUM CHLORIDE CRYS ER 20 MEQ PO TBCR
40.0000 meq | EXTENDED_RELEASE_TABLET | Freq: Once | ORAL | Status: AC
  Administered 2022-10-18: 40 meq via ORAL
  Filled 2022-10-18: qty 2

## 2022-10-18 NOTE — ED Notes (Signed)
   10/18/22 1303  Respiratory Assessment  $ RT Protocol Assessment  Yes  Assessment Type Assess only  Respiratory Pattern Regular;Unlabored;Symmetrical  Chest Assessment Chest expansion symmetrical  Cough Non-productive  R Upper  Breath Sounds Clear  L Upper Breath Sounds Clear  R Lower Breath Sounds Clear  L Lower Breath Sounds Clear  Oxygen Therapy/Pulse Ox  O2 Therapy Room air  SpO2 97 %   Ambulated from registration to triage room, SpO2 95-97%, HR 105-115, speaking in complete sentences.

## 2022-10-18 NOTE — ED Provider Notes (Signed)
  Physical Exam  BP (!) 164/86   Pulse 89   Temp 98.1 F (36.7 C)   Resp 14   Ht 5\' 4"  (1.626 m)   Wt 63.5 kg   LMP 01/11/2014 (Exact Date)   SpO2 100%   BMI 24.03 kg/m   Physical Exam  Procedures  Procedures  ED Course / MDM   Clinical Course as of 10/18/22 1558  Thu Oct 18, 2022  1459 CBC shows slightly low white blood cell count.  No anemia.  Troponin normal.  D-dimer normal.  Lipase normal [JK]  1459 Potassium is decreased at 2.5.  Sodium level is also decreased [JK]    Clinical Course User Index [JK] Linwood Dibbles, MD   Received care of patient from Dr. Lynelle Doctor.  Please see his note for prior history, physical and exam.  Briefly this is a 57 year old female with a history of chronic pain syndrome who presents with concern for episodes of nausea, low appetite, headache.  Labs completed and were personally evaluated by Dr. Lynelle Doctor and myself showing mild leukocytosis, no anemia, normal lipase, no sign of urinary tract infection, negative D-dimer, normal troponin and have low suspicion for ACS, PE.  CMP is significant for hyponatremia with a sodium of 129, hypokalemia with a potassium of 2.5, anion gap of 20 with a normal bicarb, and ketonuria noted on urinalysis.  Suspect some starvation ketosis as etiology of anion gap in setting of her not eating and drinking well.  Do feel continued outpatient evaluation with her primary care physician is appropriate, and we will also refer to cardiology given concern for some exertional nausea, lightheadedness, episodic nature of some symptoms with consideration of atypical angina or cardiac arrhythmia on differential.  Plan for continued potassium replacement while in the emergency department.  Will discharge with prescription for potassium, Phenergan, recommend PCP/Cardiology follow up.    Alvira Monday, MD 10/19/22 1112

## 2022-10-18 NOTE — ED Provider Notes (Signed)
[facility] AT [facility] Provider Note   CSN: [id number] Arrival date & time: 10/18/22  1251     History  Chief Complaint  Patient presents with   Shortness of Breath    Andrea Petersen is a 57 y.o. female.   Shortness of Breath    Patient presents to the ED for evaluation of intermittent episodes of various symptoms ongoing for the past year or so.  She thinks they are sometimes associated with being outside working in the heat or some type of exertion.  Patient states she will have intermittent headaches and the pounding sensation.  She sometimes has episodes of feeling short of breath.  She also will have episodes of poor appetite and nausea.  Patient does not having any abdominal pain but she has had vomiting.  She denies any diarrhea or dysuria.  Yesterday she started having symptoms again where she was feeling nauseated had a few episodes of vomiting.  Patient also had a headache.  She has not had any fevers or chills.  No focal numbness or weakness.  Home Medications Prior to Admission medications   Medication Sig Start Date End Date Taking? Authorizing Provider  potassium chloride SA (KLOR-CON M) 20 MEQ tablet Take 1 tablet (20 mEq total) by mouth 2 (two) times daily for 5 days. 10/18/22 10/23/22 Yes Alvira Monday, MD  promethazine (PHENERGAN) 25 MG tablet Take 1 tablet (25 mg total) by mouth every 6 (six) hours as needed for nausea or vomiting. 10/18/22  Yes Alvira Monday, MD  aspirin 325 MG tablet Take 650 mg by mouth every 4 (four) hours as needed for pain.     [provider]  cyclobenzaprine (FLEXERIL) 10 MG tablet Take 1 tablet (10 mg total) by mouth 3 (three) times daily as needed for muscle spasms. 06/25/16   Deatra Canter, FNP  HYDROcodone-acetaminophen (NORCO) 10-325 MG tablet Take 1 tablet by mouth every 6 (six) hours as needed.    [provider]  ibuprofen (ADVIL,MOTRIN) 800 MG tablet Take 800 mg by mouth  every 8 (eight) hours as needed.    [provider]  Cetirizine HCl (ZYRTEC PO) Take 10 mg by mouth daily.    01/31/14  [provider]      Allergies    Sulfa antibiotics    Review of Systems   Review of Systems  Respiratory:  Positive for shortness of breath.     Physical Exam Updated Vital Signs BP (!) 156/92   Pulse 95   Temp 98.1 F (36.7 C)   Resp 17   Ht 1.626 m (5\' 4" )   Wt 63.5 kg   LMP 01/11/2014 (Exact Date)   SpO2 99%   BMI 24.03 kg/m  Physical Exam Vitals and nursing note reviewed.  Constitutional:      General: She is not in acute distress.    Appearance: She is well-developed.  HENT:     Head: Normocephalic and atraumatic.     Right Ear: External ear normal.     Left Ear: External ear normal.  Eyes:     General: No scleral icterus.       Right eye: No discharge.        Left eye: No discharge.     Conjunctiva/sclera: Conjunctivae normal.  Neck:     Trachea: No tracheal deviation.  Cardiovascular:     Rate and Rhythm: Normal rate and regular rhythm.  Pulmonary:     Effort: Pulmonary effort is  normal. No respiratory distress.     Breath sounds: Normal breath sounds. No stridor. No wheezing or rales.  Abdominal:     General: Bowel sounds are normal. There is no distension.     Palpations: Abdomen is soft.     Tenderness: There is no abdominal tenderness. There is no guarding or rebound.  Musculoskeletal:        General: No tenderness or deformity.     Cervical back: Neck supple.     Right lower leg: No edema.     Left lower leg: No edema.  Skin:    General: Skin is warm and dry.     Findings: No rash.  Neurological:     General: No focal deficit present.     Mental Status: She is alert.     Cranial Nerves: No cranial nerve deficit, dysarthria or facial asymmetry.     Sensory: No sensory deficit.     Motor: No abnormal muscle tone or seizure activity.     Coordination: Coordination normal.  Psychiatric:        Mood and  Affect: Mood normal.     ED Results / Procedures / Treatments   Labs (all labs ordered are listed, but only abnormal results are displayed) Labs Reviewed  CBC - Abnormal; Notable for the following components:      Result Value   WBC 12.6 (*)    MCHC 37.5 (*)    Platelets 451 (*)    All other components within normal limits  COMPREHENSIVE METABOLIC PANEL - Abnormal; Notable for the following components:   Sodium 129 (*)    Potassium 2.5 (*)    Chloride 84 (*)    Total Protein 8.8 (*)    Total Bilirubin 1.4 (*)    Anion gap 20 (*)    All other components within normal limits  URINALYSIS, ROUTINE W REFLEX MICROSCOPIC - Abnormal; Notable for the following components:   Ketones, ur >=80 (*)    All other components within normal limits  LIPASE, BLOOD  D-DIMER, QUANTITATIVE  MAGNESIUM  TROPONIN I (HIGH SENSITIVITY)    EKG EKG Interpretation Date/Time:  Thursday October 18 2022 14:43:17 EDT Ventricular Rate:  85 PR Interval:  141 QRS Duration:  102 QT Interval:  369 QTC Calculation: 439 R Axis:   55  Text Interpretation: Sinus rhythm Confirmed by Linwood Dibbles [phone]) on 10/18/2022 2:59:01 PM  Radiology DG Chest 2 View  Result Date: 10/18/2022 CLINICAL DATA:  Shortness of breath EXAM: CHEST - 2 VIEW COMPARISON:  04/30/2021 FINDINGS: The heart size and mediastinal contours are within normal limits. Both lungs are clear. The visualized skeletal structures are unremarkable. IMPRESSION: No active cardiopulmonary disease. Electronically Signed   By: Duanne Guess D.O.   On: 10/18/2022 14:33    Procedures Procedures    Medications Ordered in ED Medications  lactated ringers bolus 1,000 mL (0 mLs Intravenous Stopped 10/18/22 1520)  ondansetron (ZOFRAN) injection 4 mg (4 mg Intravenous Given 10/18/22 1422)  potassium chloride SA (KLOR-CON M) CR tablet 40 mEq (40 mEq Oral Given 10/18/22 1521)  potassium chloride 10 mEq in 100 mL IVPB (0 mEq Intravenous Stopped 10/18/22 1740)   potassium chloride 10 mEq in 100 mL IVPB (0 mEq Intravenous Stopped 10/18/22 1849)    ED Course/ Medical Decision Making/ A&P Clinical Course as of 10/20/22 0758  Thu Oct 18, 2022  1459 CBC shows slightly low white blood cell count.  No anemia.  Troponin normal.  D-dimer normal.  Lipase  normal [JK]  1459 Potassium is decreased at 2.5.  Sodium level is also decreased [JK]    Clinical Course User Index [JK] Linwood Dibbles, MD                                 Medical Decision Making Amount and/or Complexity of Data Reviewed Labs: ordered. Radiology: ordered.  Risk Prescription drug management.   Pt presented to the ED with various symptoms ongoing intermittently for a year.  Exam reassuring. CXR without pna.  Low risk for PE, d dimer, negative, doubt pe.  Troponin normal, no signs of cardiac injury.  Tolerating po, no vomiting in the ED.  Labs notable for hypokalemia.   Treated with iv and oral potassium in the ED.  Unclear cause for her hypokalemia but appropriate for continued oral replacement as outpatient and follow up with pcp for further evaluation of her symptoms.  Evaluation and diagnostic testing in the emergency department does not suggest an emergent condition requiring admission or immediate intervention beyond what has been performed at this time.  The patient is safe for discharge and has been instructed to return immediately for worsening symptoms, change in symptoms or any other concerns.         Final Clinical Impression(s) / ED Diagnoses Final diagnoses:  Generalized weakness  Hypokalemia  Hyponatremia  Nausea and vomiting, unspecified vomiting type  Dyspnea, unspecified type    Rx / DC Orders ED Discharge Orders          Ordered    Ambulatory referral to Cardiology        10/18/22 1900    potassium chloride SA (KLOR-CON M) 20 MEQ tablet  2 times daily        10/18/22 1900    promethazine (PHENERGAN) 25 MG tablet  Every 6 hours PRN        10/18/22 1901               Linwood Dibbles, MD 10/20/22 [phone]

## 2022-10-18 NOTE — ED Notes (Signed)
Up to b/r, steady gait 

## 2022-10-18 NOTE — ED Notes (Signed)
Ambulatory to radiology, steady gait

## 2022-10-18 NOTE — ED Notes (Signed)
Alert, NAD, calm, interactive, steady gait, back from b/r

## 2022-10-18 NOTE — ED Triage Notes (Addendum)
Pt states SOB, headache, and nausea that has been on and off for a year  Denies cough  Denis pain at this time    Brett Canales, RT at bedside

## 2022-10-23 ENCOUNTER — Encounter: Payer: Self-pay | Admitting: *Deleted

## 2022-10-23 DIAGNOSIS — F32A Depression, unspecified: Secondary | ICD-10-CM | POA: Insufficient documentation

## 2022-10-23 HISTORY — DX: Depression, unspecified: F32.A

## 2022-10-24 ENCOUNTER — Ambulatory Visit: Payer: BC Managed Care – PPO

## 2022-10-24 VITALS — BP 130/80 | HR 84 | Ht 64.0 in | Wt 163.0 lb

## 2022-10-24 DIAGNOSIS — E871 Hypo-osmolality and hyponatremia: Secondary | ICD-10-CM

## 2022-10-24 DIAGNOSIS — R0600 Dyspnea, unspecified: Secondary | ICD-10-CM | POA: Insufficient documentation

## 2022-10-24 DIAGNOSIS — E876 Hypokalemia: Secondary | ICD-10-CM | POA: Insufficient documentation

## 2022-10-24 DIAGNOSIS — I10 Essential (primary) hypertension: Secondary | ICD-10-CM | POA: Diagnosis not present

## 2022-10-24 HISTORY — DX: Dyspnea, unspecified: R06.00

## 2022-10-24 HISTORY — DX: Hypo-osmolality and hyponatremia: E87.1

## 2022-10-24 HISTORY — DX: Hypokalemia: E87.6

## 2022-10-24 MED ORDER — HYDROCHLOROTHIAZIDE 25 MG PO TABS
25.0000 mg | ORAL_TABLET | Freq: Every day | ORAL | 3 refills | Status: AC
Start: 2022-10-24 — End: 2023-01-22

## 2022-10-24 MED ORDER — AMLODIPINE BESYLATE 5 MG PO TABS: 5.0000 mg | ORAL_TABLET | Freq: Every day | ORAL | 3 refills | Status: DC

## 2022-10-24 NOTE — Patient Instructions (Signed)
Medication Instructions:  Your physician has recommended you make the following change in your medication:  Start Amlodipine 5 mg once daily Reduce HcTz to 25 mg once daily  *If you need a refill on your cardiac medications before your next appointment, please call your pharmacy*   Lab Work: Your physician recommends that you return for lab work in: Today for BMP  If you have labs (blood work) drawn today and your tests are completely normal, you will receive your results only by: MyChart Message (if you have MyChart) OR A paper copy in the mail If you have any lab test that is abnormal or we need to change your treatment, we will call you to review the results.   Testing/Procedures: Your physician has requested that you have an echocardiogram. Echocardiography is a painless test that uses sound waves to create images of your heart. It provides your doctor with information about the size and shape of your heart and how well your heart's chambers and valves are working. This procedure takes approximately one hour. There are no restrictions for this procedure. Please do NOT wear cologne, perfume, aftershave, or lotions (deodorant is allowed). Please arrive 15 minutes prior to your appointment time.    Follow-Up: At [facility], you and your health needs are our priority.  As part of our continuing mission to provide you with exceptional heart care, we have created designated Provider Care Teams.  These Care Teams include your primary Cardiologist (physician) and Advanced Practice Providers (APPs -  Physician Assistants and Nurse Practitioners) who all work together to provide you with the care you need, when you need it.  We recommend signing up for the patient portal called "MyChart".  Sign up information is provided on this After Visit Summary.  MyChart is used to connect with patients for Virtual Visits (Telemedicine).  Patients are able to view lab/test results, encounter notes,  upcoming appointments, etc.  Non-urgent messages can be sent to your provider as well.   To learn more about what you can do with MyChart, go to [email or web address].    Your next appointment:  As needed after testing    Provider:  Dr. Vincent Gros  Other Instructions Amlodipine; Atorvastatin Tablets What is this medication? AMLODIPINE; ATORVASTATIN (am LOE di peen; a TORE va sta tin) treats high blood pressure and prevents chest pain (angina). It works by relaxing blood vessels, which decreases the amount of work the heart has to do. It may also be used to treat high cholesterol and reduce the risk of heart attack and stroke. It works by decreasing bad cholesterol and fats (such as LDL, triglycerides) and increasing good cholesterol (HDL) in your blood. It is a combination of a calcium channel blocker and statin. Changes to diet and exercise are often combined with this medication. This medicine may be used for other purposes; ask your health care provider or pharmacist if you have questions. COMMON BRAND NAME(S): Caduet What should I tell my care team before I take this medication? They need to know if you have any of these conditions: Diabetes (high blood sugar) Frequently drink alcohol Kidney disease Liver disease Low blood pressure Heart disease Muscle cramps, pain Thyroid disease Seizures An unusual or allergic reaction to amlodipine, atorvastatin, other medications, foods, dyes, or preservatives Pregnant or trying to get pregnant Breast-feeding How should I use this medication? Take this medication by mouth. Take it as directed on the prescription label at the same time every day. Do not cut, crush or  chew this medication. Swallow the tablets whole. You can take it with or without food. If it upsets your stomach, take it with food. Keep taking it unless your care team tells you to stop. Do not take this medication with grapefruit juice. Talk to your care team about the use of  this medication in children. Special care may be needed. Overdosage: If you think you have taken too much of this medicine contact a poison control center or emergency room at once. NOTE: This medicine is only for you. Do not share this medicine with others. What if I miss a dose? If you miss a dose, take it as soon as you can. If it is almost time for your next dose, take only that dose. Do not take double or extra doses. What may interact with this medication? Do not take this medication with any of the following: Other cholesterol medications known as statins, such as fluvastatin, lovastatin, pravastatin, and simvastatin Red yeast rice Telaprevir Telithromycin Voriconazole This medication may also interact with the following: Antiviral medications for HIV or AIDS Boceprevir Certain medications for cholesterol, such as clofibrate, fenofibrate, and gemfibrozil Cyclosporine Digoxin Estrogen or progestin hormones Grapefruit juice Medications for fungal infections, such as fluconazole, itraconazole, ketoconazole, voriconazole Medications for high blood pressure Niacin Rifampin Some antibiotics, such as clarithromycin, erythromycin, and troleandomycin This list may not describe all possible interactions. Give your health care provider a list of all the medicines, herbs, non-prescription drugs, or dietary supplements you use. Also tell them if you smoke, drink alcohol, or use illegal drugs. Some items may interact with your medicine. What should I watch for while using this medication? Visit your care team for regular checks on your progress. Tell your care team if your symptoms do not start to get better or if they get worse. Your care team may tell you to stop taking this medication if you develop muscle problems. If your muscle problems do not go away after stopping this medication, contact your care team. Talk to your care team if you wish to become pregnant or think you might be  pregnant. This medication can cause serious birth defects. Talk to your care team before breast-feeding. Changes to your treatment plan may be needed. Check your blood pressure as directed. Ask your care team what your blood pressure should be. Also, find out when you should contact them. This medication may increase blood sugar. Ask your care team if changes in diet or medications are needed if you have diabetes. You may get drowsy or dizzy. Do not drive, use machinery, or do anything that needs mental alertness until you know how this medication affects you. Do not stand up or sit up quickly, especially if you are an older patient. This reduces the risk of dizzy or fainting spells. What side effects may I notice from receiving this medication? Side effects that you should report to your care team as soon as possible: Allergic reactions--skin rash, itching, hives, swelling of the face, lips, tongue, or throat Heart attack--pain or tightness in the chest, shoulders, arms, or jaw, nausea, shortness of breath, cold or clammy skin, feeling faint or lightheaded High blood sugar (hyperglycemia)--increased thirst or amount of urine, unusual weakness or fatigue, blurry vision Liver injury--right upper belly pain, loss of appetite, nausea, light-colored stool, dark yellow or brown urine, yellowing skin or eyes, unusual weakness or fatigue Low blood pressure--dizziness, feeling faint or lightheaded, blurry vision Muscle injury--unusual weakness or fatigue, muscle pain, dark yellow or brown urine,  decrease in amount of urine Redness, blistering, peeling, or loosening of the skin, including inside the mouth Worsening chest pain (angina)--pain, pressure, or tightness in the chest, neck, back, or arms Side effects that usually do not require medical attention (report to your care team if they continue or are bothersome): Diarrhea Facial flushing, redness Nausea Stomach pain Swelling of the ankles, hands, or  feet Trouble sleeping This list may not describe all possible side effects. Call your doctor for medical advice about side effects. You may report side effects to FDA at 1-800-FDA-1088. Where should I keep my medication? Keep out of the reach of children and pets. Store at room temperature between 15 and 30 degrees C (59 and 86 degrees F). Keep this medication in the original container. Protect from moisture. Get rid of any unused medication after the expiration date. To get rid of medications that are no longer needed or have expired: Take the medication to a medication take-back program. Check with your pharmacy or law enforcement to find a location. If you cannot return the medication, check the label or package insert to see if the medication should be thrown out in the garbage or flushed down the toilet. If you are not sure, ask your care team. If it is safe to put it in the trash, take the medication out of the container. Mix the medication with cat litter, dirt, coffee grounds, or other unwanted substance. Seal the mixture in a bag or container. Put it in the trash. NOTE: This sheet is a summary. It may not cover all possible information. If you have questions about this medicine, talk to your doctor, pharmacist, or health care provider.  2024 Elsevier/Gold Standard (2021-03-09 00:00:00)

## 2022-10-24 NOTE — Progress Notes (Signed)
Cardiology Consultation:    Date:  10/24/2022   ID:  Andrea Petersen, DOB 1965/07/18, MRN [id number]  PCP:  Delfino Lovett, FNP  Cardiologist:  Marlyn Corporal Felcia Huebert, MD   Referring MD: Alvira Monday, MD   Chief Complaint  Patient presents with   Shortness of Breath     ASSESSMENT AND PLAN:   Andrea Petersen 57 year old woman with history of hypertension, depression, chronic lumbar pain on narcotics for pain control with occasional symptoms of abdominal symptoms with lack of appetite, nausea vomiting, during 1 such episode last week she had an ER visit for atypical symptoms associated with dehydration, hyponatremia, hypokalemia that resolved with IV fluids and potassium replacement.  No follow-up labs since. Here for cardiology evaluation for atypical symptoms of shortness of breath.  Her description palpitations are infrequent and do not appear to bother her.  Problem List Items Addressed This Visit     Hypertensive disorder    Under good control. She mentions this might be because she used marijuana earlier today.  In the setting of her GI symptoms and recent hypokalemia and hyponatremia, will lower the dose of hydrochlorothiazide to 25 mg once daily.  Target blood pressure below 130 over 80 mmHg.  For blood pressure control will add amlodipine 5 mg once daily.  Discussed risk benefits of the medications and possible side effects.  Will obtain transthoracic echocardiogram to assess cardiac structure and function.      Relevant Medications   amLODipine (NORVASC) 5 MG tablet   hydrochlorothiazide (HYDRODIURIL) 25 MG tablet   Other Relevant Orders   Basic metabolic panel   ECHOCARDIOGRAM COMPLETE   Hypokalemia    Recent ER visit August 22 with severe hypokalemia potassium 2.5 associated with hyponatremia in the setting of reduced oral intake, nausea and vomiting and ongoing hydrochlorothiazide use.  Her GI symptoms have resolved and her appetite is back to normal. She has  been taking potassium replacement that was prescribed by ER.  Will obtain repeat BMP that has not been obtained since her ER visit as she does not have a PCP.  If her potassium levels normalize, she should not take any further potassium supplementation.  I am lowering the dose of hydrochlorothiazide as reviewed under hypertension management      Hyponatremia    See assessment and plan under hypokalemia.      Dyspnea - Primary    Nonspecific symptoms during her ER visit and associated with metabolic abnormalities. She has since been feeling much improved. She has reduced functional capacity chronically due to her back pain. Symptoms do not sound anginal.  Will assess cardiac structure and function with transthoracic echocardiogram as reviewed under hypertension management above.       Relevant Orders   Basic metabolic panel   ECHOCARDIOGRAM COMPLETE  She was strongly advised to set up care with a primary care provider for regular healthcare visits and medication titration. Return to clinic based on test results.    History of Present Illness:    Andrea Petersen is a 57 y.o. female who is being seen today for the evaluation of shortness of breath and pounding sensation in the chest at the request of ER attending Alvira Monday, MD.  She is currently in the process of getting established with a PCP.  She has history of hypertension, depression, chronic lumbar pain, on chronic narcotic use and occasional marijuana use for pain control.  She has no significant prior cardiac history. Was diagnosed with hypertension earlier in March  and was started on hydrochlorothiazide, dose was subsequently increased to 50 mg once a day couple months ago.  She has had chronic back pain, and uses daily analgesics Percocet 3-4 times a day.  Day-to-day physical activity is mostly sedentary as she works in an [employer].  No regular exercise.  She reports intermittent episodes of GI  symptoms associated with lack of appetite, poor p.o. intake, nausea and vomiting.  Reports 2 intense episodes in the past 2 years that required visits to the ER.    Last week she had similar symptoms for couple days, no food intake, however took fluids and her meds includiing hydrochlorothiazide. Had a recent visit to the ER on August 22, for symptoms of headaches and pounding sensation in the chest.  TIn the ER she was noted to be mildly hypertensive with blood pressure 156/92 labs noted hyponatremia 129, hypokalemia 2.5, high-sensitivity troponin and D-dimer were unremarkable.  Renal function was normal with BUN 11 and creatinine 0.96, anion gap was elevated at 20.  Mild leukocytosis 12.6 and platelets 451.  Normal hemoglobin and hematocrit.  Symptoms were felt secondary to starvation ketoacidosis and she was discharged home after receiving fluid replacements and potassium replacement and recommended to follow-up with PCP and cardiology.  She has started to feel better. Appetite improved.  Denies any chest pain.  She describes occasional sensation shortness of breath with activity.  Not associated with any chest pain.  Denies any lightheadedness, dizziness or syncopal episodes.  Occasionally she notices an extra beat or skipped beat, this occurs couple times a month and not associated with any other significant symptoms. No orthopnea, paroxysmal nocturnal dyspnea, pedal edema.  She does not smoke tobacco. Uses marijuana occasionally for pain control. Occasional alcohol consumption 1-2 drinks.  Recent  EKG from October 18, 2022 at the ER visit noted sinus rhythm heart rate 85/min.  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   aspirin 325 MG tablet Take 650 mg by mouth every 4 (four) hours as needed for pain.    buPROPion (WELLBUTRIN XL) 300 MG 24 hr tablet Take 300 mg by mouth daily.   cyclobenzaprine (FLEXERIL) 10 MG tablet Take 1 tablet (10 mg total) by mouth 3 (three) times daily as needed for muscle spasms.   estradiol (VIVELLE-DOT) 0.075 MG/24HR Place 1 patch onto the skin 2 (two) times a week.   hydrochlorothiazide (HYDRODIURIL) 25 MG tablet Take 1 tablet (25 mg total) by mouth daily.   ibuprofen (ADVIL,MOTRIN) 800 MG tablet Take 800 mg by mouth every 8 (eight) hours as needed.   naloxone (NARCAN) nasal spray 4 mg/0.1 mL Place 1 spray into the nose once.   oxyCODONE-acetaminophen (PERCOCET) 10-325 MG tablet Take 1 tablet by mouth 4 (four) times daily as needed.   progesterone (PROMETRIUM) 100 MG capsule Take 1  tablet by mouth daily.   promethazine (PHENERGAN) 25 MG tablet Take 1 tablet (25 mg total) by mouth every 6 (six) hours as needed for nausea or vomiting.   [DISCONTINUED] hydrochlorothiazide (HYDRODIURIL) 50 MG tablet Take 50 mg by mouth every morning.   Current Facility-Administered Medications for the 10/24/22 encounter (Office Visit) with Meztli Llanas, Marlyn Corporal, MD  Medication   0.9 %  sodium chloride infusion     Allergies:   Sulfa antibiotics   Social History   Socioeconomic History   Marital status: Single    Spouse name: Not on file   Number of children: Not on file   Years of education: Not on file   Highest education level: Not on file  Occupational History   Not on file  Tobacco Use   Smoking status: Never   Smokeless tobacco: Never  Substance and Sexual  Activity   Alcohol use: Yes    Comment: occasional   Drug use: No   Sexual activity: Yes    Partners: Male    Comment: [employer]  Other Topics Concern   Not on file  Social History Narrative   Not on file   Social Determinants of Health   Financial Resource Strain: Not on file  Food Insecurity: Not on file  Transportation Needs: Not on file  Physical Activity: Not on file  Stress: Not on file  Social Connections: Not on file     Family History: The patient's family history includes Breast cancer in her mother; COPD in her father; Cancer in her mother; Colon cancer in her father; Hyperlipidemia in her brother; Hypertension in her mother; Miscarriages / [place] in her mother. There is no history of Esophageal cancer, Rectal cancer, or Stomach cancer. ROS:   Please see the history of present illness.    All 14 point review of systems negative except as described per history of present illness.  EKGs/Labs/Other Studies Reviewed:    The following studies were reviewed today:  Recent Labs: 10/18/2022: ALT 21; BUN 11; Creatinine, Ser 0.96; Hemoglobin 14.7; Magnesium 2.1; Platelets 451; Potassium 2.5; Sodium 129  Recent Lipid Panel No results found for: "CHOL", "TRIG", "HDL", "CHOLHDL", "VLDL", "LDLCALC", "LDLDIRECT"  Physical Exam:    VS:  BP 130/80 (BP Location: Right Arm, Patient Position: Sitting, Cuff Size: Normal)   Pulse 84   Ht 5\' 4"  (1.626 m)   Wt 163 lb (73.9 kg)   LMP 01/11/2014 (Exact Date)   SpO2 96%   BMI 27.98 kg/m     Wt Readings from Last 3 Encounters:  10/24/22 163 lb (73.9 kg)  10/18/22 139 lb 15.9 oz (63.5 kg)  04/30/21 140 lb (63.5 kg)     GENERAL:  Well nourished, well developed in no acute distress NECK: No JVD; No carotid bruits CARDIAC: RRR, S1 and S2 present, no murmurs, no rubs, no gallops CHEST:  Clear to auscultation without rales, wheezing or rhonchi  Extremities: No pitting pedal edema. Pulses bilaterally symmetric with radial 2+ and  dorsalis pedis 2+ NEUROLOGIC:  Alert and oriented x 3  Medication Adjustments/Labs and Tests Ordered: Current medicines are reviewed at length with the patient today.  Concerns regarding medicines are outlined above.  Orders Placed This Encounter  Procedures   Basic metabolic panel   ECHOCARDIOGRAM COMPLETE   Meds ordered this encounter  Medications   amLODipine (NORVASC) 5 MG tablet    Sig: Take 1 tablet (5 mg total) by mouth daily.    Dispense:  180 tablet  Refill:  3   hydrochlorothiazide (HYDRODIURIL) 25 MG tablet    Sig: Take 1 tablet (25 mg total) by mouth daily.    Dispense:  90 tablet    Refill:  3    Signed, Korbin Notaro reddy Charleston Hankin, MD, MPH, [facility]. 10/24/2022 2:28 PM     [facility]

## 2022-10-24 NOTE — Assessment & Plan Note (Addendum)
Under good control. She mentions this might be because she used marijuana earlier today.  In the setting of her GI symptoms and recent hypokalemia and hyponatremia, will lower the dose of hydrochlorothiazide to 25 mg once daily.  Target blood pressure below 130 over 80 mmHg.  For blood pressure control will add amlodipine 5 mg once daily.  Discussed risk benefits of the medications and possible side effects.  Will obtain transthoracic echocardiogram to assess cardiac structure and function.

## 2022-10-24 NOTE — Assessment & Plan Note (Signed)
See assessment and plan under hypokalemia ?

## 2022-10-24 NOTE — Assessment & Plan Note (Signed)
Nonspecific symptoms during her ER visit and associated with metabolic abnormalities. She has since been feeling much improved. She has reduced functional capacity chronically due to her back pain. Symptoms do not sound anginal.  Will assess cardiac structure and function with transthoracic echocardiogram as reviewed under hypertension management above.

## 2022-10-24 NOTE — Assessment & Plan Note (Signed)
Recent ER visit August 22 with severe hypokalemia potassium 2.5 associated with hyponatremia in the setting of reduced oral intake, nausea and vomiting and ongoing hydrochlorothiazide use.  Her GI symptoms have resolved and her appetite is back to normal. She has been taking potassium replacement that was prescribed by ER.  Will obtain repeat BMP that has not been obtained since her ER visit as she does not have a PCP.  If her potassium levels normalize, she should not take any further potassium supplementation.  I am lowering the dose of hydrochlorothiazide as reviewed under hypertension management

## 2022-10-25 LAB — BASIC METABOLIC PANEL
BUN/Creatinine Ratio: 17 (ref 9–23)
BUN: 17 mg/dL (ref 6–24)
CO2: 27 mmol/L (ref 20–29)
Calcium: 10.1 mg/dL (ref 8.7–10.2)
Chloride: 93 mmol/L — ABNORMAL LOW (ref 96–106)
Creatinine, Ser: 1 mg/dL (ref 0.57–1.00)
Glucose: 92 mg/dL (ref 70–99)
Potassium: 4.3 mmol/L (ref 3.5–5.2)
Sodium: 136 mmol/L (ref 134–144)
eGFR: 66 mL/min/{1.73_m2} (ref >59)

## 2022-11-06 ENCOUNTER — Telehealth: Payer: Self-pay

## 2022-11-06 NOTE — Telephone Encounter (Signed)
-----   Message from Marlyn Corporal Madireddy sent at 10/25/2022  8:29 AM EDT ----- Hi Andrea Petersen, Please inform Ms. Schoenfelder her lab results show normal electrolytes and kidney function. Her potassium levels are normal as expected. Please ask her to discontinue the potassium supplements she had been taking.  Thank you.

## 2022-11-06 NOTE — Telephone Encounter (Signed)
Patient notified through my chart, med list updated.

## 2022-11-28 ENCOUNTER — Ambulatory Visit: Payer: BC Managed Care – PPO

## 2022-11-28 DIAGNOSIS — I10 Essential (primary) hypertension: Secondary | ICD-10-CM | POA: Diagnosis not present

## 2022-11-28 DIAGNOSIS — R0600 Dyspnea, unspecified: Secondary | ICD-10-CM

## 2022-11-28 LAB — ECHOCARDIOGRAM COMPLETE
Area-P 1/2: 3.84 cm2
S' Lateral: 3.2 cm

## 2022-12-08 ENCOUNTER — Encounter (HOSPITAL_BASED_OUTPATIENT_CLINIC_OR_DEPARTMENT_OTHER): Payer: Self-pay | Admitting: Emergency Medicine

## 2022-12-08 ENCOUNTER — Emergency Department (HOSPITAL_BASED_OUTPATIENT_CLINIC_OR_DEPARTMENT_OTHER)
Admission: EM | Admit: 2022-12-08 | Discharge: 2022-12-08 | Disposition: A | Discharge status: Home / Self Care | Payer: BC Managed Care – PPO | Attending: Emergency Medicine | Admitting: Emergency Medicine

## 2022-12-08 ENCOUNTER — Other Ambulatory Visit: Payer: Self-pay

## 2022-12-08 DIAGNOSIS — J029 Acute pharyngitis, unspecified: Secondary | ICD-10-CM | POA: Diagnosis present

## 2022-12-08 DIAGNOSIS — Z1152 Encounter for screening for COVID-19: Secondary | ICD-10-CM | POA: Diagnosis not present

## 2022-12-08 DIAGNOSIS — J020 Streptococcal pharyngitis: Secondary | ICD-10-CM | POA: Insufficient documentation

## 2022-12-08 DIAGNOSIS — Z7982 Long term (current) use of aspirin: Secondary | ICD-10-CM | POA: Insufficient documentation

## 2022-12-08 LAB — RESP PANEL BY RT-PCR (RSV, FLU A&B, COVID)  RVPGX2
Influenza A by PCR: NEGATIVE
Influenza B by PCR: NEGATIVE
Resp Syncytial Virus by PCR: NEGATIVE
SARS Coronavirus 2 by RT PCR: NEGATIVE

## 2022-12-08 LAB — GROUP A STREP BY PCR: Group A Strep by PCR: DETECTED — AB

## 2022-12-08 MED ORDER — DEXAMETHASONE SODIUM PHOSPHATE 10 MG/ML IJ SOLN
10.0000 mg | Freq: Once | INTRAMUSCULAR | Status: AC
  Administered 2022-12-08: 10 mg via INTRAMUSCULAR
  Filled 2022-12-08: qty 1

## 2022-12-08 MED ORDER — AMOXICILLIN 500 MG PO CAPS: 500.0000 mg | ORAL_CAPSULE | Freq: Two times a day (BID) | ORAL | 0 refills | Status: AC

## 2022-12-08 NOTE — ED Triage Notes (Signed)
Sore throat since Thursday. Concerned for strep.

## 2022-12-08 NOTE — ED Provider Notes (Signed)
EMERGENCY DEPARTMENT AT [facility] Provider Note   CSN: [id number] Arrival date & time: 12/08/22  1328     History  Chief Complaint  Patient presents with   Sore Throat    Andrea Petersen is a 57 y.o. female.  Patient is a 57 year old female who presents with a 2-day history of worsening sore throat.  She is felt feverish but has not checked her temperature.  She denies any significant runny nose or congestion.  No vomiting.  She says it hurts to swallow but is still able to swallow.  No rash.       Home Medications Prior to Admission medications   Medication Sig Start Date End Date Taking? Authorizing Provider  amoxicillin (AMOXIL) 500 MG capsule Take 1 capsule (500 mg total) by mouth 2 (two) times daily. 12/08/22  Yes Rolan Bucco, MD  amLODipine (NORVASC) 5 MG tablet Take 1 tablet (5 mg total) by mouth daily. 10/24/22 01/22/23  Madireddy, Marlyn Corporal, MD  aspirin 325 MG tablet Take 650 mg by mouth every 4 (four) hours as needed for pain.     [provider]  buPROPion (WELLBUTRIN XL) 300 MG 24 hr tablet Take 300 mg by mouth daily.    [provider]  cyclobenzaprine (FLEXERIL) 10 MG tablet Take 1 tablet (10 mg total) by mouth 3 (three) times daily as needed for muscle spasms. 06/25/16   Deatra Canter, FNP  estradiol (VIVELLE-DOT) 0.075 MG/24HR Place 1 patch onto the skin 2 (two) times a week. 06/20/21   [provider]  hydrochlorothiazide (HYDRODIURIL) 25 MG tablet Take 1 tablet (25 mg total) by mouth daily. 10/24/22 01/22/23  Madireddy, Marlyn Corporal, MD  ibuprofen (ADVIL,MOTRIN) 800 MG tablet Take 800 mg by mouth every 8 (eight) hours as needed.    [provider]  naloxone [facility]) nasal spray 4 mg/0.1 mL Place 1 spray into the nose once. 05/15/22   [provider]  oxyCODONE-acetaminophen (PERCOCET) 10-325 MG tablet Take 1 tablet by mouth 4 (four) times daily as needed.    [provider]   progesterone (PROMETRIUM) 100 MG capsule Take 1 tablet by mouth daily. 07/16/22   [provider]  promethazine (PHENERGAN) 25 MG tablet Take 1 tablet (25 mg total) by mouth every 6 (six) hours as needed for nausea or vomiting. 10/18/22   Alvira Monday, MD      Allergies    Sulfa antibiotics    Review of Systems   Review of Systems  Constitutional:  Positive for fatigue and fever.  HENT:  Positive for sore throat. Negative for congestion and rhinorrhea.   Respiratory:  Negative for cough and shortness of breath.   Gastrointestinal:  Negative for nausea and vomiting.  Skin:  Negative for rash.  Neurological:  Positive for headaches.    Physical Exam Updated Vital Signs BP 113/64 (BP Location: Left Arm)   Pulse (!) 103   Temp 99.6 F (37.6 C) (Oral)   Resp 16   Ht 5' 4.5" (1.638 m)   Wt 63 kg   LMP 01/11/2014 (Exact Date)   SpO2 95%   BMI 23.49 kg/m  Physical Exam Constitutional:      Appearance: She is well-developed.  HENT:     Head: Normocephalic and atraumatic.     Mouth/Throat:     Mouth: Mucous membranes are moist.     Comments: Positive erythema and exudates to the posterior pharynx bilaterally.  There is swelling to the tonsils bilaterally but  no peritonsillar fullness.  Uvula is midline.  No trismus.  No elevation of the tongue. Eyes:     Pupils: Pupils are equal, round, and reactive to light.  Cardiovascular:     Rate and Rhythm: Normal rate and regular rhythm.     Heart sounds: Normal heart sounds.  Pulmonary:     Effort: Pulmonary effort is normal. No respiratory distress.     Breath sounds: Normal breath sounds. No wheezing or rales.  Chest:     Chest wall: No tenderness.  Abdominal:     General: Bowel sounds are normal.     Palpations: Abdomen is soft.     Tenderness: There is no abdominal tenderness. There is no guarding or rebound.  Musculoskeletal:        General: Normal range of motion.     Cervical back: Normal range of motion and  neck supple.  Lymphadenopathy:     Cervical: Cervical adenopathy present.  Skin:    General: Skin is warm and dry.     Findings: No rash.  Neurological:     Mental Status: She is alert and oriented to person, place, and time.     ED Results / Procedures / Treatments   Labs (all labs ordered are listed, but only abnormal results are displayed) Labs Reviewed  GROUP A STREP BY PCR - Abnormal; Notable for the following components:      Result Value   Group A Strep by PCR DETECTED (*)    All other components within normal limits  RESP PANEL BY RT-PCR (RSV, FLU A&B, COVID)  [id number]    EKG None  Radiology No results found.  Procedures Procedures    Medications Ordered in ED Medications  dexamethasone (DECADRON) injection 10 mg (has no administration in time range)    ED Course/ Medical Decision Making/ A&P                                 Medical Decision Making Risk Prescription drug management.   Patient is a 57 year old with a sore throat.  Her strep test is positive.  COVID/flu test are negative.  She does not have any other clinical symptoms that we more suggestive of peritonsillar abscess or other deep tissue infection.  I did give her the option of Bicillin injection versus oral amoxicillin and she is choosing the amoxicillin.  Given her difficulty with swallowing, I did offer her a Decadron shot which she is amenable to.  She was given a Decadron.  She was discharged home in good condition.  Was given prescription to start amoxicillin.  Return precautions were given.  Final Clinical Impression(s) / ED Diagnoses Final diagnoses:  Strep pharyngitis    Rx / DC Orders ED Discharge Orders          Ordered    amoxicillin (AMOXIL) 500 MG capsule  2 times daily        12/08/22 1439              Rolan Bucco, MD 12/08/22 1445

## 2022-12-11 ENCOUNTER — Telehealth: Payer: Self-pay

## 2022-12-11 NOTE — Telephone Encounter (Signed)
Called pt about ECHO results. VM full.

## 2023-08-19 DIAGNOSIS — R110 Nausea: Secondary | ICD-10-CM | POA: Insufficient documentation

## 2023-12-17 IMAGING — MG MM DIGITAL DIAGNOSTIC UNILAT*L* W/ TOMO W/ CAD
4 series · 4 of 12 positions shown · non-contrast
Comparison: Previous exam(s).

CLINICAL DATA: Patient returns after screening study for evaluation
of possible LEFT breast asymmetry. Patient's mother was diagnosed
with breast cancer.

EXAM:
DIGITAL DIAGNOSTIC UNILATERAL LEFT MAMMOGRAM WITH TOMOSYNTHESIS AND
CAD
TECHNIQUE: Left digital diagnostic mammography and breast tomosynthesis was
performed. The images were evaluated with computer-aided detection.

[L ML synth-2D]
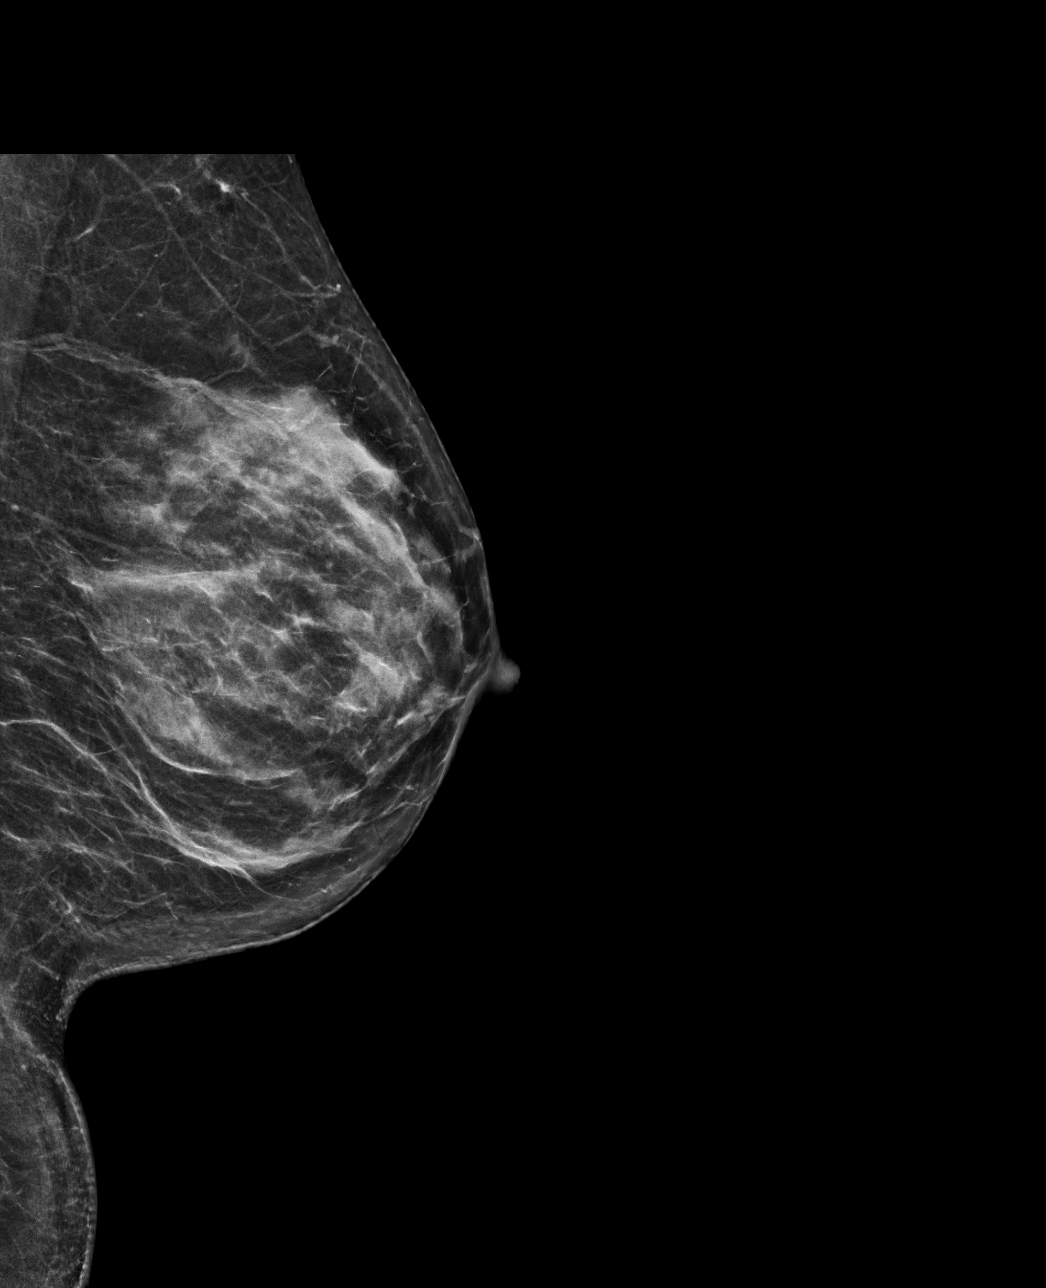

[L MLO synth-2D]
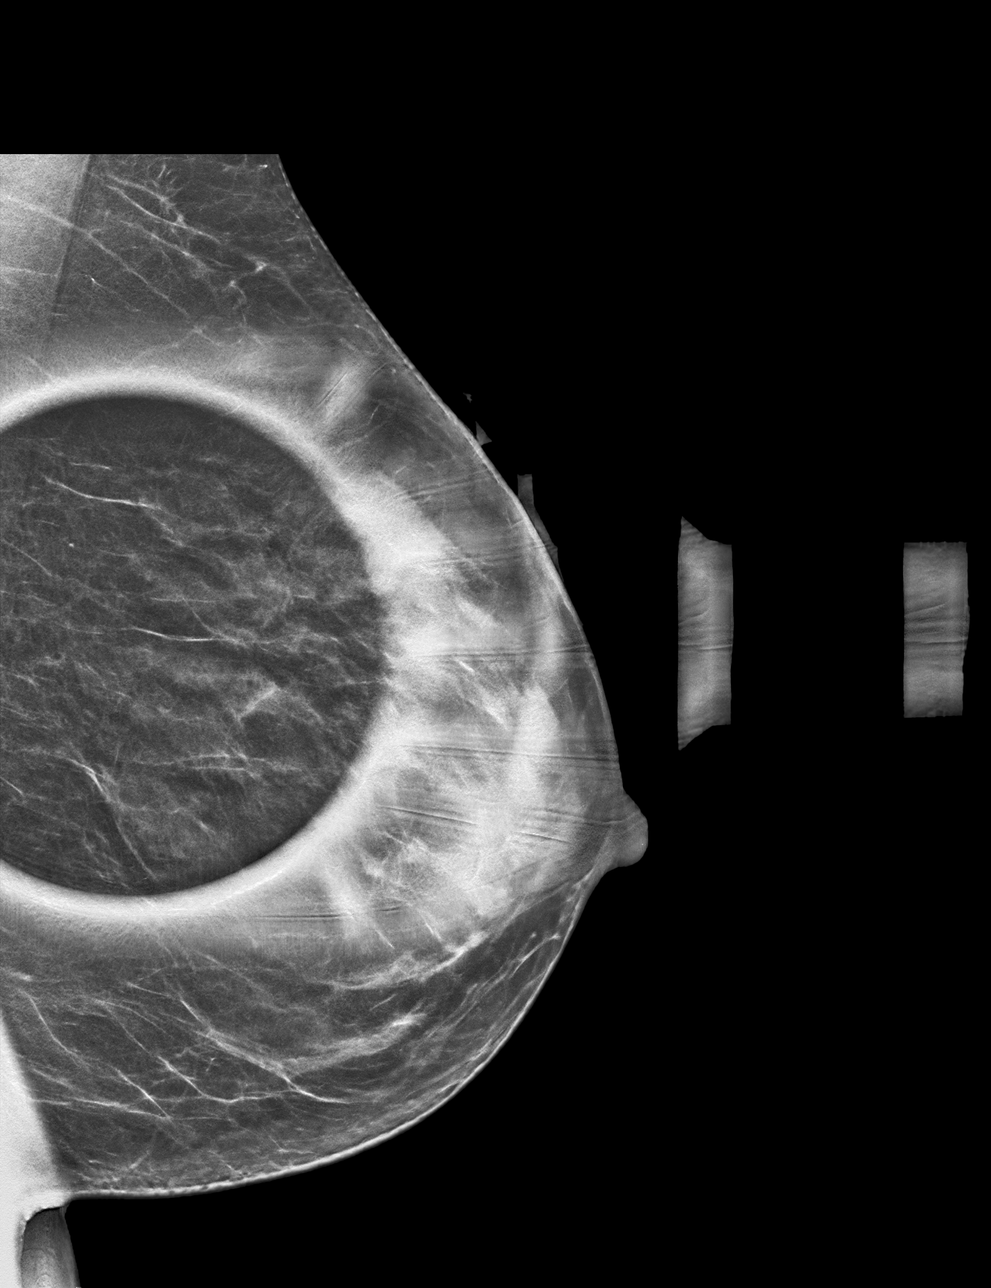

[L MLO tomo · tomo slice 25/50.0]
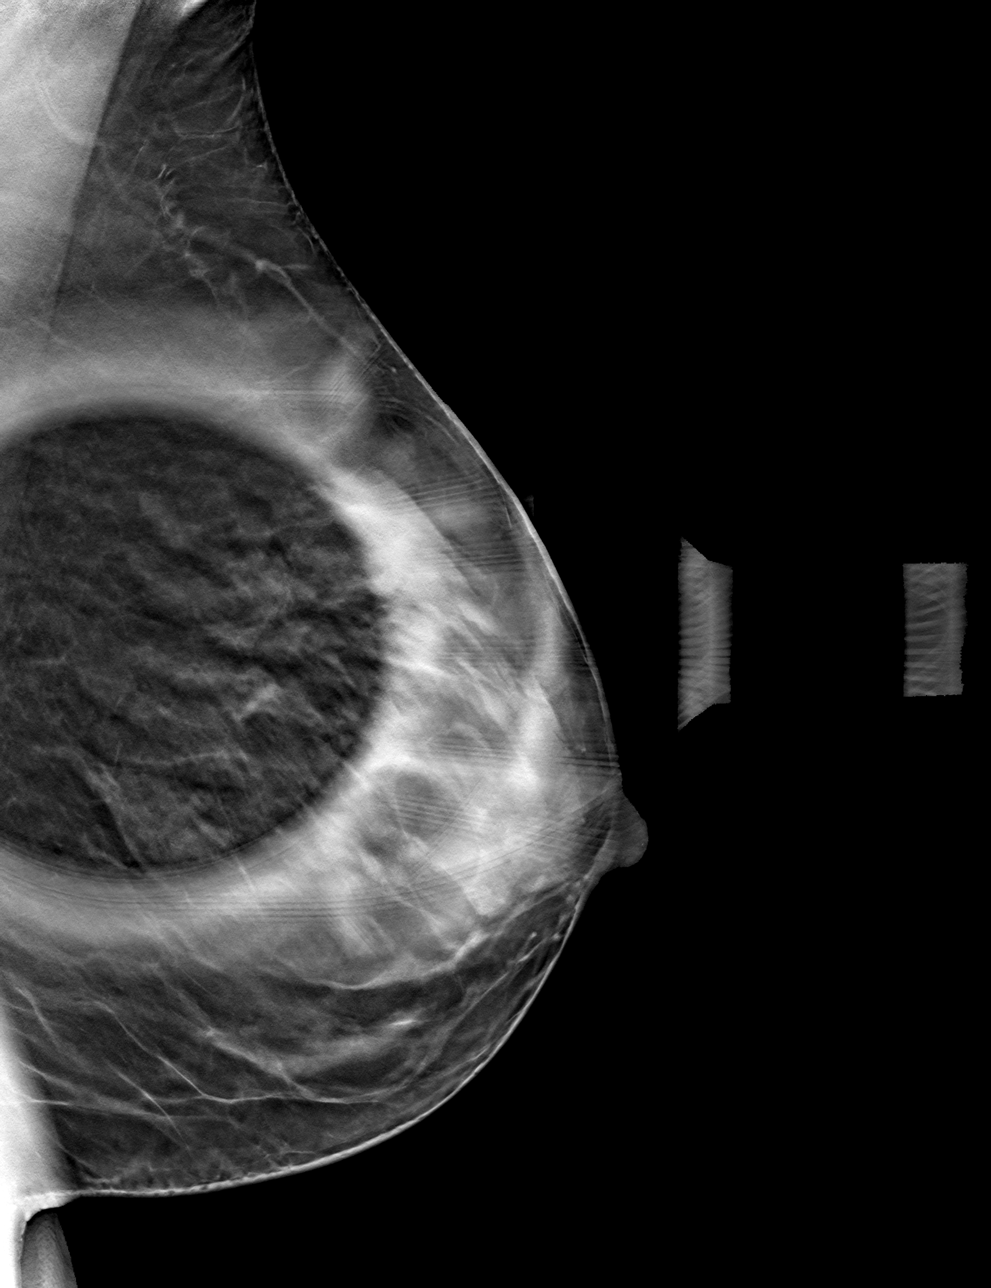

[L ML tomo · tomo slice 29/58.0]
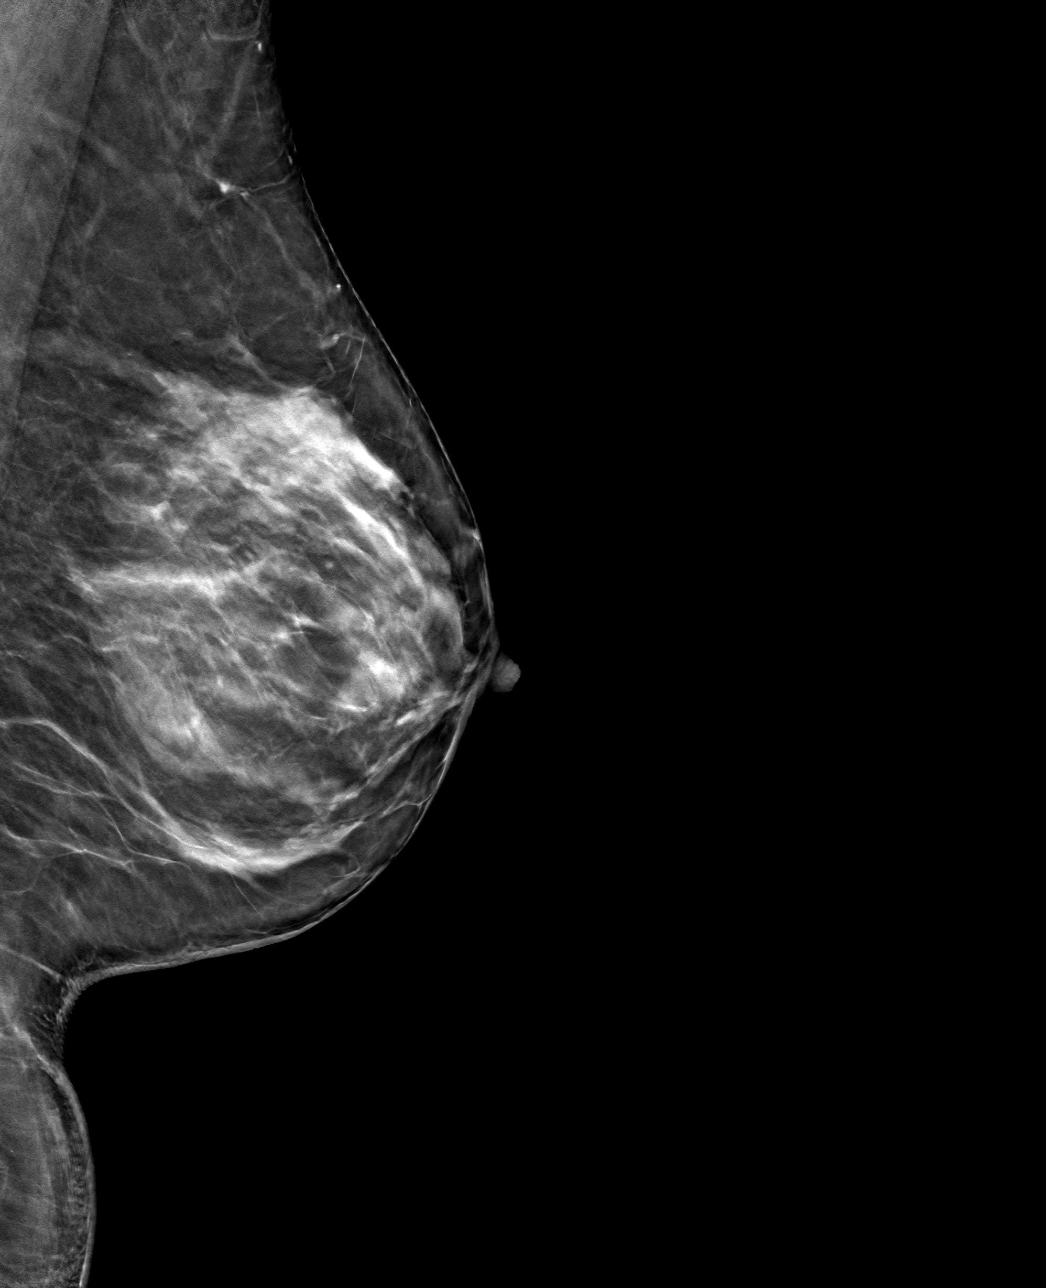

[4 of 12 positions shown; findings below may reference images not displayed]

ACR Breast Density Category c: The breast tissue is heterogeneously
dense, which may obscure small masses.
FINDINGS: Additional 2-D and 3-D images are performed. These views show no
persistent asymmetry or mass in the UPPER portion of the LEFT
breast. No suspicious mass, distortion, or microcalcifications are
identified to suggest presence of malignancy.
IMPRESSION: No mammographic evidence for malignancy.

RECOMMENDATION:
Screening mammogram in one year.(Code:IE-1-H6Z)

I have discussed the findings and recommendations with the patient.
If applicable, a reminder letter will be sent to the patient
regarding the next appointment.

BI-RADS CATEGORY  1: Negative.

## 2024-01-04 ENCOUNTER — Other Ambulatory Visit: Payer: Self-pay

## 2024-01-06 ENCOUNTER — Other Ambulatory Visit: Payer: Self-pay

## 2024-01-24 IMAGING — CR DG CHEST 2V
2 series · 2 of 2 positions shown · non-contrast
Comparison: None.

CLINICAL DATA: Shortness of breath.

EXAM:
CHEST - 2 VIEW

[w chest pa]
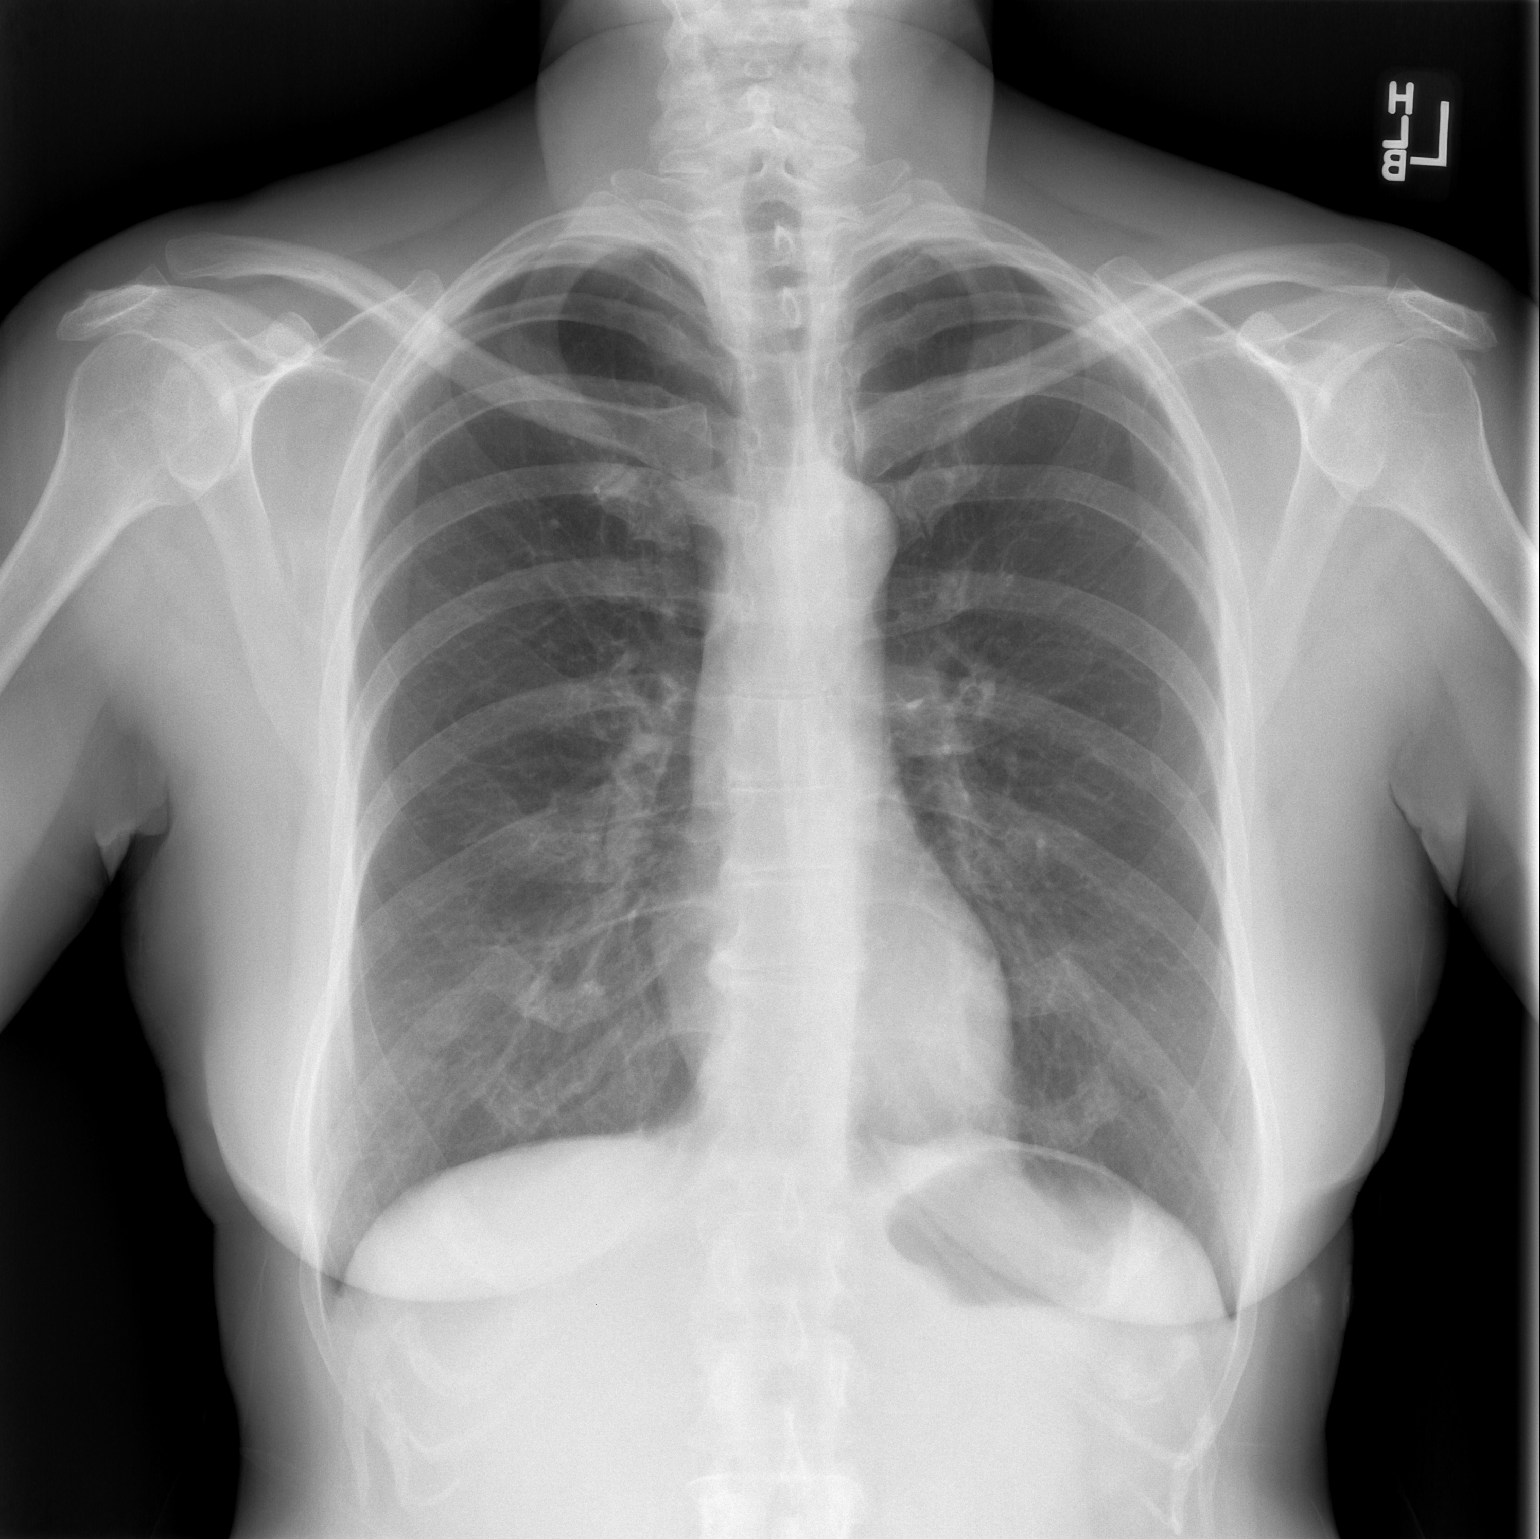

[w chest lat]
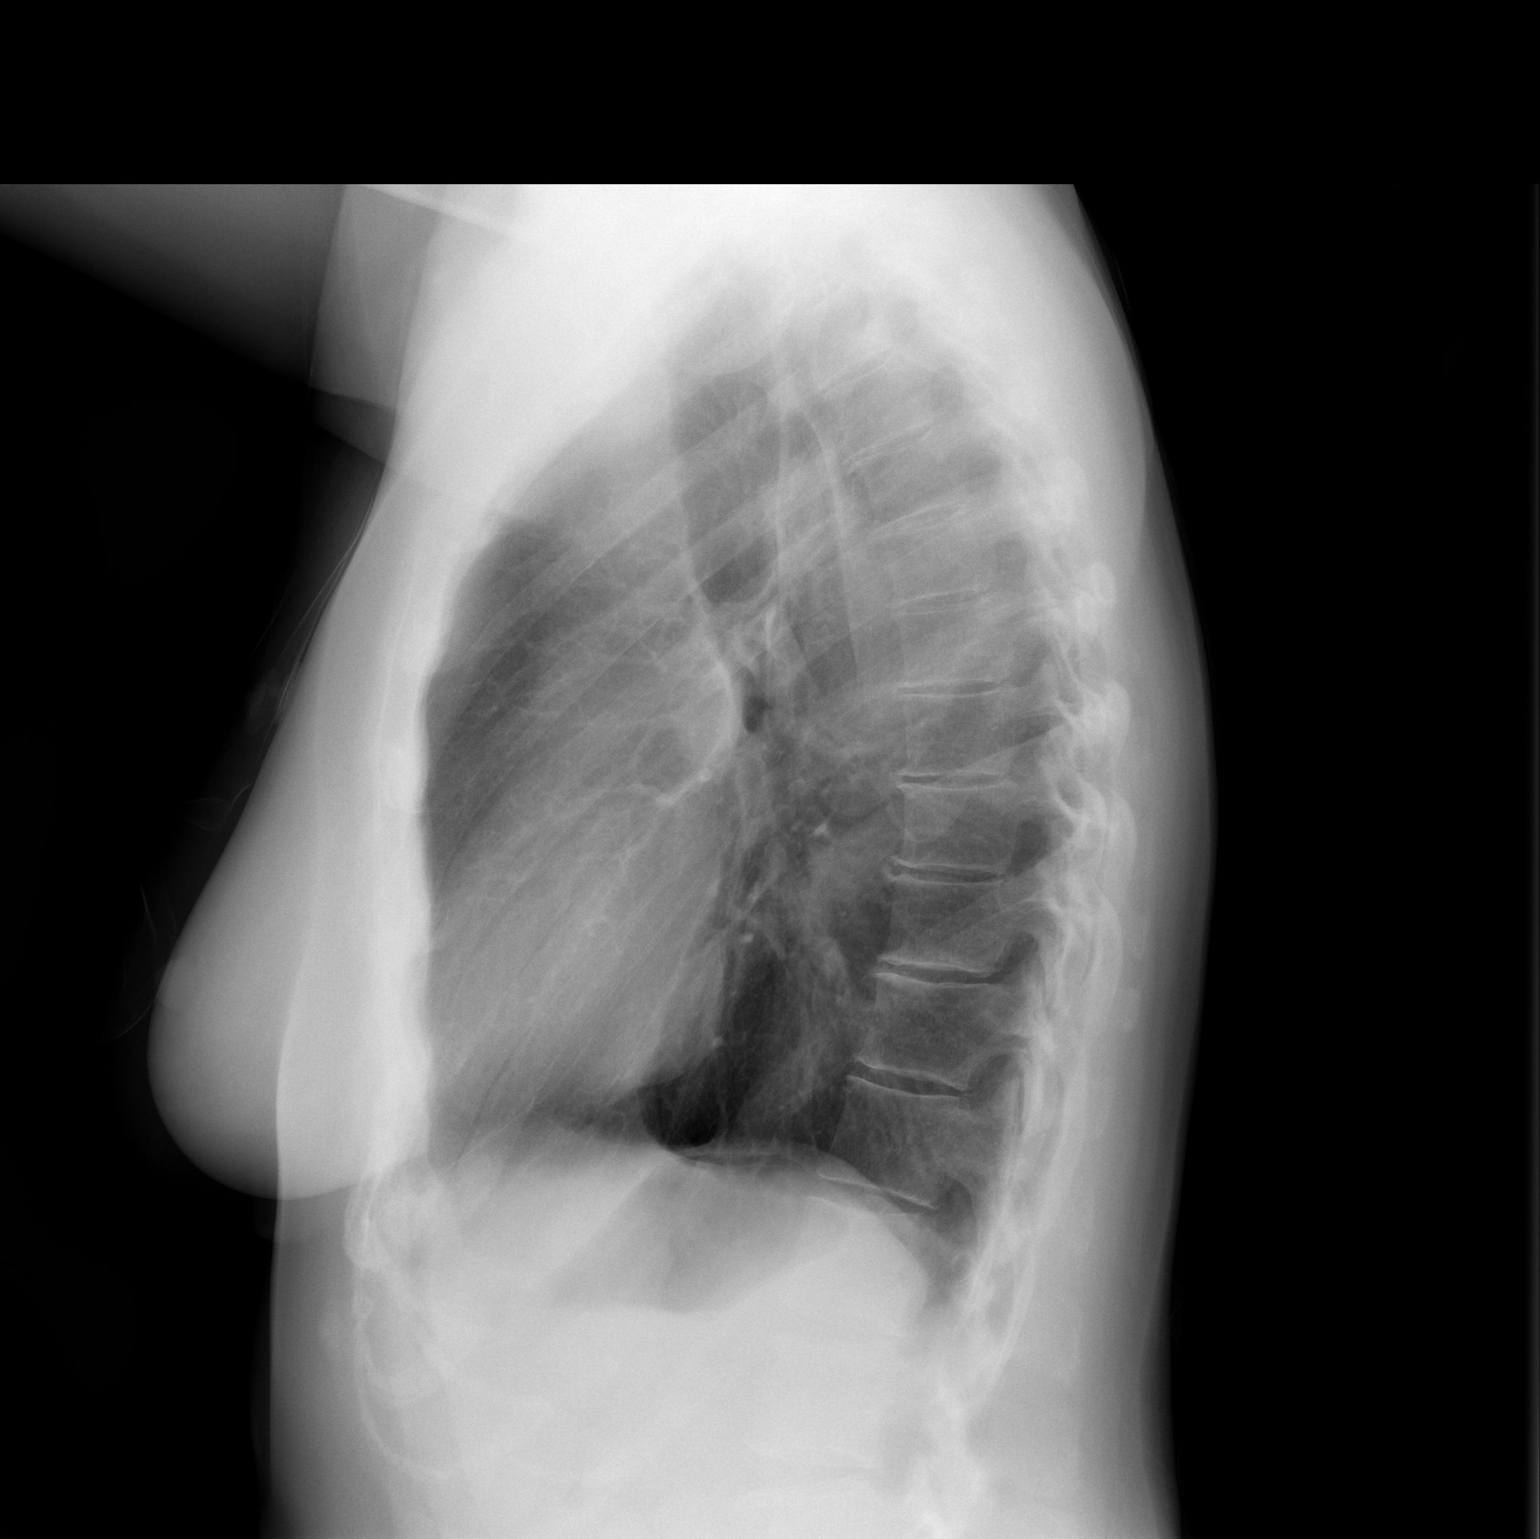

[2 of 2 positions shown; findings below may reference images not displayed]

FINDINGS: The heart size and mediastinal contours are within normal limits.
Both lungs are clear. The visualized skeletal structures are
unremarkable.
IMPRESSION: No active cardiopulmonary disease.

## 2024-02-24 ENCOUNTER — Other Ambulatory Visit: Payer: Self-pay

## 2024-03-15 ENCOUNTER — Other Ambulatory Visit: Payer: Self-pay

## 2024-03-18 ENCOUNTER — Telehealth: Payer: Self-pay

## 2024-03-31 NOTE — Telephone Encounter (Signed)
" °*  STAT* If patient is at the pharmacy, call can be transferred to refill team.   1. Which medications need to be refilled? (please list name of each medication and dose if known)   amLODipine  (NORVASC ) 5 MG tablet   2. Would you like to learn more about the convenience, safety, & potential cost savings by using the [facility]?   3. Are you open to using the [facility] (Type [facility]. ).  4. Which pharmacy/location (including street and city if local pharmacy) is medication to be sent to?  [id number] - [place], [place] - [address]   5. Do they need a 30 day or 90 day supply?   Patient stated she is only has 1 tablet left.  Patient has appointment scheduled with Dr. Liborio on 2/10. "

## 2024-04-01 MED ORDER — AMLODIPINE BESYLATE 5 MG PO TABS: 5.0000 mg | ORAL_TABLET | Freq: Every day | ORAL | 0 refills | Status: AC

## 2024-04-01 NOTE — Addendum Note (Signed)
 Addended by: MEMORY DELON POUR on: 04/01/2024 07:39 AM   Modules accepted: Orders

## 2024-04-01 NOTE — Telephone Encounter (Signed)
 Pt scheduled to see Dr. Liborio 04/07/24, refilled until appointment.

## 2024-04-03 ENCOUNTER — Other Ambulatory Visit: Payer: Self-pay

## 2024-04-03 DIAGNOSIS — T7840XA Allergy, unspecified, initial encounter: Secondary | ICD-10-CM | POA: Insufficient documentation

## 2024-04-03 DIAGNOSIS — M5126 Other intervertebral disc displacement, lumbar region: Secondary | ICD-10-CM | POA: Insufficient documentation

## 2024-04-03 DIAGNOSIS — F32A Depression, unspecified: Secondary | ICD-10-CM | POA: Insufficient documentation

## 2024-04-07 ENCOUNTER — Ambulatory Visit
